# Patient Record
Sex: Female | Born: 1935 | Race: White | Hispanic: No | Marital: Single | Smoking: Former smoker
Health system: Southern US, Community
[De-identification: ages and names within clinical notes are randomized; demographics above are authoritative.]

## PROBLEM LIST (undated history)

## (undated) DIAGNOSIS — F4310 Post-traumatic stress disorder, unspecified: Secondary | ICD-10-CM

## (undated) DIAGNOSIS — F329 Major depressive disorder, single episode, unspecified: Secondary | ICD-10-CM

## (undated) DIAGNOSIS — G20A1 Parkinson's disease without dyskinesia, without mention of fluctuations: Secondary | ICD-10-CM

## (undated) DIAGNOSIS — E079 Disorder of thyroid, unspecified: Secondary | ICD-10-CM

## (undated) DIAGNOSIS — F32A Depression, unspecified: Secondary | ICD-10-CM

## (undated) DIAGNOSIS — G20 Parkinson's disease: Secondary | ICD-10-CM

## (undated) HISTORY — PX: CHOLECYSTECTOMY: SHX55

## (undated) HISTORY — PX: EYE SURGERY: SHX253

## (undated) HISTORY — DX: Disorder of thyroid, unspecified: E07.9

## (undated) HISTORY — DX: Parkinson's disease without dyskinesia, without mention of fluctuations: G20.A1

## (undated) HISTORY — DX: Depression, unspecified: F32.A

## (undated) HISTORY — DX: Major depressive disorder, single episode, unspecified: F32.9

## (undated) HISTORY — DX: Post-traumatic stress disorder, unspecified: F43.10

## (undated) HISTORY — DX: Parkinson's disease: G20

---

## 2006-12-24 ENCOUNTER — Ambulatory Visit: Payer: Self-pay | Admitting: Gastroenterology

## 2006-12-31 ENCOUNTER — Ambulatory Visit: Payer: Self-pay | Admitting: Internal Medicine

## 2007-01-08 ENCOUNTER — Ambulatory Visit (HOSPITAL_COMMUNITY): Admission: RE | Admit: 2007-01-08 | Discharge: 2007-01-08 | Payer: Self-pay | Admitting: Gastroenterology

## 2007-01-14 ENCOUNTER — Ambulatory Visit: Payer: Self-pay | Admitting: Gastroenterology

## 2007-04-14 ENCOUNTER — Ambulatory Visit: Payer: Self-pay | Admitting: General Surgery

## 2007-05-26 ENCOUNTER — Ambulatory Visit: Payer: Self-pay | Admitting: Gastroenterology

## 2007-09-15 ENCOUNTER — Ambulatory Visit: Payer: Self-pay | Admitting: Otolaryngology

## 2007-09-17 ENCOUNTER — Ambulatory Visit: Payer: Self-pay | Admitting: Otolaryngology

## 2007-10-07 ENCOUNTER — Ambulatory Visit: Payer: Self-pay | Admitting: Internal Medicine

## 2007-12-28 ENCOUNTER — Ambulatory Visit: Payer: Self-pay | Admitting: Internal Medicine

## 2007-12-31 ENCOUNTER — Ambulatory Visit: Payer: Self-pay | Admitting: Specialist

## 2008-01-11 ENCOUNTER — Ambulatory Visit: Payer: Self-pay | Admitting: Cardiovascular Disease

## 2008-02-05 ENCOUNTER — Ambulatory Visit: Payer: Self-pay | Admitting: Specialist

## 2008-04-05 ENCOUNTER — Ambulatory Visit: Payer: Self-pay | Admitting: Unknown Physician Specialty

## 2008-07-06 ENCOUNTER — Ambulatory Visit: Payer: Self-pay | Admitting: Specialist

## 2008-08-11 ENCOUNTER — Ambulatory Visit: Payer: Self-pay | Admitting: Gastroenterology

## 2009-01-18 ENCOUNTER — Ambulatory Visit: Payer: Self-pay | Admitting: Internal Medicine

## 2009-03-20 ENCOUNTER — Ambulatory Visit: Payer: Self-pay | Admitting: Rheumatology

## 2009-04-10 ENCOUNTER — Ambulatory Visit: Payer: Self-pay | Admitting: Unknown Physician Specialty

## 2009-06-07 ENCOUNTER — Ambulatory Visit: Payer: Self-pay | Admitting: Gastroenterology

## 2010-01-23 ENCOUNTER — Ambulatory Visit: Payer: Self-pay | Admitting: Internal Medicine

## 2010-01-26 ENCOUNTER — Inpatient Hospital Stay: Payer: Self-pay | Admitting: Internal Medicine

## 2010-02-08 ENCOUNTER — Ambulatory Visit: Payer: Self-pay | Admitting: Neurology

## 2010-08-21 ENCOUNTER — Ambulatory Visit: Payer: Self-pay

## 2011-05-21 ENCOUNTER — Ambulatory Visit: Payer: Self-pay

## 2012-01-28 ENCOUNTER — Ambulatory Visit: Payer: Self-pay | Admitting: Cardiovascular Disease

## 2012-01-28 LAB — BASIC METABOLIC PANEL
Anion Gap: 6 — ABNORMAL LOW (ref 7–16)
BUN: 17 mg/dL (ref 7–18)
Chloride: 107 mmol/L (ref 98–107)
Creatinine: 0.86 mg/dL (ref 0.60–1.30)
EGFR (African American): >60
EGFR (Non-African Amer.): >60
Glucose: 83 mg/dL (ref 65–99)
Osmolality: 282 (ref 275–301)
Potassium: 3.8 mmol/L (ref 3.5–5.1)

## 2012-01-28 LAB — CBC WITH DIFFERENTIAL/PLATELET
Basophil #: 0 10*3/uL (ref 0.0–0.1)
Basophil %: 0.9 %
Eosinophil #: 0.2 10*3/uL (ref 0.0–0.7)
HCT: 35.1 % (ref 35.0–47.0)
HGB: 12.1 g/dL (ref 12.0–16.0)
Lymphocyte %: 42.2 %
MCH: 32 pg (ref 26.0–34.0)
MCHC: 34.4 g/dL (ref 32.0–36.0)
Monocyte %: 12.2 %
Neutrophil #: 2.1 10*3/uL (ref 1.4–6.5)
RBC: 3.78 10*6/uL — ABNORMAL LOW (ref 3.80–5.20)
RDW: 12.9 % (ref 11.5–14.5)

## 2012-06-29 ENCOUNTER — Ambulatory Visit: Payer: Self-pay | Admitting: Internal Medicine

## 2013-01-04 ENCOUNTER — Ambulatory Visit: Payer: Self-pay | Admitting: Internal Medicine

## 2013-02-12 ENCOUNTER — Observation Stay: Payer: Self-pay | Admitting: Internal Medicine

## 2013-02-12 LAB — URINALYSIS, COMPLETE
Bilirubin,UR: NEGATIVE
Blood: NEGATIVE
Glucose,UR: NEGATIVE mg/dL (ref 0–75)
Leukocyte Esterase: NEGATIVE
Nitrite: NEGATIVE
RBC,UR: 1 /HPF (ref 0–5)
Specific Gravity: 1.028 (ref 1.003–1.030)
Squamous Epithelial: NONE SEEN

## 2013-02-12 LAB — COMPREHENSIVE METABOLIC PANEL
Albumin: 3.8 g/dL (ref 3.4–5.0)
Anion Gap: 7 (ref 7–16)
BUN: 17 mg/dL (ref 7–18)
Calcium, Total: 9.4 mg/dL (ref 8.5–10.1)
Chloride: 104 mmol/L (ref 98–107)
Creatinine: 0.97 mg/dL (ref 0.60–1.30)
EGFR (African American): >60
EGFR (Non-African Amer.): 57 — ABNORMAL LOW
Osmolality: 277 (ref 275–301)
Potassium: 4.2 mmol/L (ref 3.5–5.1)
SGPT (ALT): 21 U/L (ref 12–78)
Sodium: 138 mmol/L (ref 136–145)
Total Protein: 6.6 g/dL (ref 6.4–8.2)

## 2013-02-12 LAB — CBC
HCT: 35.7 % (ref 35.0–47.0)
HGB: 11.8 g/dL — ABNORMAL LOW (ref 12.0–16.0)
MCHC: 33.1 g/dL (ref 32.0–36.0)
MCV: 94 fL (ref 80–100)
Platelet: 222 10*3/uL (ref 150–440)
RBC: 3.79 10*6/uL — ABNORMAL LOW (ref 3.80–5.20)
RDW: 12.8 % (ref 11.5–14.5)
WBC: 5.5 10*3/uL (ref 3.6–11.0)

## 2013-02-12 LAB — TROPONIN I: Troponin-I: <0.02 ng/mL

## 2013-02-13 ENCOUNTER — Ambulatory Visit: Payer: Self-pay | Admitting: Neurology

## 2013-02-13 DIAGNOSIS — I359 Nonrheumatic aortic valve disorder, unspecified: Secondary | ICD-10-CM

## 2013-02-13 LAB — LIPID PANEL
Cholesterol: 141 mg/dL (ref 0–200)
HDL Cholesterol: 47 mg/dL (ref 40–60)
Ldl Cholesterol, Calc: 69 mg/dL (ref 0–100)
VLDL Cholesterol, Calc: 25 mg/dL (ref 5–40)

## 2013-02-13 LAB — TSH: Thyroid Stimulating Horm: 1.58 u[IU]/mL

## 2013-02-13 LAB — MAGNESIUM: Magnesium: 1.8 mg/dL

## 2013-06-09 DIAGNOSIS — Z87442 Personal history of urinary calculi: Secondary | ICD-10-CM | POA: Insufficient documentation

## 2013-06-09 DIAGNOSIS — J449 Chronic obstructive pulmonary disease, unspecified: Secondary | ICD-10-CM | POA: Insufficient documentation

## 2013-06-09 DIAGNOSIS — F329 Major depressive disorder, single episode, unspecified: Secondary | ICD-10-CM | POA: Insufficient documentation

## 2013-06-09 DIAGNOSIS — J310 Chronic rhinitis: Secondary | ICD-10-CM | POA: Insufficient documentation

## 2013-06-09 DIAGNOSIS — K589 Irritable bowel syndrome without diarrhea: Secondary | ICD-10-CM | POA: Insufficient documentation

## 2013-06-09 DIAGNOSIS — K219 Gastro-esophageal reflux disease without esophagitis: Secondary | ICD-10-CM | POA: Insufficient documentation

## 2013-06-09 DIAGNOSIS — I10 Essential (primary) hypertension: Secondary | ICD-10-CM | POA: Insufficient documentation

## 2013-06-09 DIAGNOSIS — F32A Depression, unspecified: Secondary | ICD-10-CM | POA: Insufficient documentation

## 2013-06-09 DIAGNOSIS — E7800 Pure hypercholesterolemia, unspecified: Secondary | ICD-10-CM | POA: Insufficient documentation

## 2013-06-09 DIAGNOSIS — E042 Nontoxic multinodular goiter: Secondary | ICD-10-CM | POA: Insufficient documentation

## 2013-06-09 DIAGNOSIS — G250 Essential tremor: Secondary | ICD-10-CM | POA: Insufficient documentation

## 2014-04-12 ENCOUNTER — Ambulatory Visit: Payer: Self-pay | Admitting: Internal Medicine

## 2014-06-24 NOTE — Discharge Summary (Signed)
PATIENT NAME:  Kelly Blackburn, Kelly Blackburn MR#:  [id number] DATE OF BIRTH:  1934/12/03  DATE OF ADMISSION:  02/12/2013 DATE OF DISCHARGE:  02/14/2013  DISCHARGE DIAGNOSES: 1.  Transient ischemic attack. 2.  Hypertension.  3.  Hyperlipidemia.  4.  Viral gastroenteritis.  CONDITION ON DISCHARGE: Stable.   CODE STATUS: FULL code.   DISCHARGE DIAGNOSIS:  1.  Aricept 23 mg oral tablet once a day.  2.  Dexilant 60 mg oral delayed-release capsule once a day.  3.  Dicyclomine 10 mg oral capsule 3 times a day as needed.  4.  Levothyroxine 50 mcg oral tablet once a day.  5.  Lisinopril 20 mg oral tablet once a day.  6.  Gabapentin 600 mg once a day.  7.  Isosorbide mononitrate 60 mg extended-release once a day.  8.  Clopidogrel 75 mg once a day.  9.  Alprazolam 1 mg oral tablet 2 times a day.  10.  Propranolol 80 mg once a day.  11.  Atorvastatin 20 mg once a day.   DIET ON DISCHARGE: Low sodium, low fat, low cholesterol, Diet consistency: Regular.   ACTIVITY LIMITATION: As tolerated.   TIMEFRAME TO FOLLOW-UP: Within 1 to 2 weeks in neurology clinic. Advised to follow with Dr. Sherryll Blackburn in 2 weeks in neurology.   HISTORY AND HOSPITAL COURSE: The patient is a 80 year old Caucasian female with history of hypertension, hyperlipidemia, and hypothyroid who presented in the ED with left-sided weakness for 2 days. Complained of slurred speech and facial numbness both sides, especially on the left, and also had headache and leg numbness, denied any dysphagia, so admitted for observation for possible TIA. Work-up was ordered to look for any stroke or source of TIA. Carotid Doppler study, echocardiogram, and MRI were done. They were not showing any significant finding. Neurology saw the patient and suggested to get CT angiogram, which we also got without any significant finding and so we decided to discharge the patient home with followup in neurology clinic.  OTHER MEDICAL ISSUES: 1.  Hypertension. We continued  lisinopril. 2.  Hyperlipidemia. We continued statin. 3.  Gastroesophageal reflux disease. Continued PPI.  4.  History of seizure, which was questionable. The patient was following with neurologist, Dr. Sherryll Blackburn, in the office and he advised to continue following the same.  5.  During the hospital stay, the patient also had complaint of diarrhea and vomiting which she got better the next day with symptomatic management and no fever or rise in white cell count so we attributed it to viral gastroenteritis and advised to drink a lot of water and reassured that it will get corrected within 1 or 2 days.  IMPORTANT LABORATORY AND DIAGNOSTICS IN HOSPITAL: White cell count was 5.5, hemoglobin was 11.8. Creatinine was 0.97, potassium was 4.2. Troponin was less than 0.02. Urinalysis was negative. CT head without contrast: No CT evidence of acute infarction. Lipid panel: Cholesterol 141, triglyceride 123, HDL 47, and LDL 69. Echocardiogram showed ejection fraction of 55% to 60%, normal global left ventricular systolic function, impaired relaxation pattern of left ventricular diastolic filling. MRI of brain: Negative for intracranial hemorrhage. No mass or edema. Small hyperintensities in subcortical white matter bilaterally consistent with mild chronic microvascular ischemia. Brain stem and cerebellum are normal. CT angiogram performed which showed no acute intracranial abnormality. Negative CTA of head.   TOTAL TIME SPENT ON THIS DISCHARGE: 40 minutes. ____________________________ Kelly Blackburn [name], MD vgv:[id number] D: 02/15/2013 13:46:01 ET T: 02/15/2013 14:18:49 ET JOB#: [id number]  cc: Kelly Blackburn  Arrie EasternG. Deyanna Mctier, MD, <Dictator> Kelly LovelessNeelam S. Khan, MD Kelly K. Sherryll BurgerShah, MD  Altamese DillingVAIBHAVKUMAR Selby Slovacek MD ELECTRONICALLY SIGNED 02/22/2013 14:21

## 2014-06-24 NOTE — Consult Note (Signed)
Referring Physician:  Demetrios Loll :   Reason for Consult: Admit Date: 12-Feb-2013  Chief Complaint: "My legs keeps getting this burning feeling, and it happened again yesterday but I also had a hard time speaking"  Reason for Consult: transient neurologic deficit   History of Present Illness: History of Present Illness:   Kelly Blackburn is a 80 yoright-handed  woman with a history of HTN, dyslipidemia, and hypothyroidism who presented to [facility] for evaluation of transient sensory changes associated with difficulty speaking. She was in her usual state of health yesterday morning around 1000 when she suddenly felt bilateral burning sensation in her legs with moderate constant headache, similar to episodes she has been having intermittently for the last several weeks. This time it was associated with an abnormal "tightening" sensation in both sides of her face. Subsequently she developed dysarthria and word-finding difficulties, as well as a feeling of left-sided heaviness and decreased sensation over the left side of her body. Her family commented that they felt the left side of her face was drooping. She presented to [facility] via EMS, and the symptoms resolved en route after a total duration about about 30 minutes. note, the spells of headache, facial tightness, and bilateral leg burning sensation began only 2 or 3 weeks ago. She was seen by an outpatient neruologist who she says performed an EEG, which was normal, and prescribed a seizure medication (does not remember which). She did not feel that she needed this medication and did not take it. Since that time, she has experienced these episodes once every few days, but this is the first epidose associated with lateralizing neurologic deficits. These episodes occur at random times and do not seem to be precipitated by any certain position or activity.  ROS:  Review of Systems   As per HPI. In addition, the patient denies fevers, chills, visual changes,  chest discomfort, palpitations, cough, shortness of breath, nausea, vomiting, diarrhea, joint tenderness, or rashes. The remainder of a full 12-point review of systems is negative.   Past Medical/Surgical Hx:  hypothyroidism:   gerd:   hypertension:   cyst on kidney:   cyst on thyroid:   depression:   arthritis:   high cholesterol:   angina:   seasonal allergies:   cholecystectomy:   tonsillectomy:   Home Medications: Medication Instructions Last Modified Date/Time  Aricept 23 mg oral tablet 1 tab(s) orally once a day (at bedtime) 12-Dec-14 20:33  Bepreve 1.5% ophthalmic solution 1 drop(s) to each affected eye 2 times a day 12-Dec-14 20:33  Dexilant 60 mg oral delayed release capsule 1 cap(s) orally once a day 12-Dec-14 20:33  dicyclomine 10 mg oral capsule 1 cap(s) orally 3 times a day, As Needed 12-Dec-14 20:33  levothyroxine 50 mcg (0.05 mg) oral tablet 1 tab(s) orally once a day 12-Dec-14 20:33  lisinopril 10 mg oral tablet 1 tab(s) orally once a day 12-Dec-14 20:33  gabapentin 600 mg oral tablet 1 tab(s) orally once a day (at bedtime) 12-Dec-14 20:33  isosorbide mononitrate 60 mg oral tablet, extended release 1 tab(s) orally once a day (in the morning) 12-Dec-14 20:33  clopidogrel 75 mg oral tablet 1 tab(s) orally once a day 12-Dec-14 20:33  ALPRAZolam 1 mg oral tablet 1 tab(s) orally 2 times a day 12-Dec-14 20:33  propranolol 80 mg oral tablet 1 tab(s) orally once a day 12-Dec-14 20:33  nitroglycerin 0.4 mg sublingual tablet 1 tab(s) sublingual every 5 minutes up to 3 tabs as needed for chest pain. *if no relief  call md or go to emergency room* 12-Dec-14 20:33   Allergies:  Aspirin: GI Distress  Social/Family History: Lives With: alone  Social History: Quit smoking 30 years ago. Denies EtOH or illicit drug use.   Vital Signs: **Vital Signs.:   13-Dec-14 20:13  Vital Signs Type Routine  Temperature Temperature (F) 98.1  Celsius 36.7  Pulse Pulse 61  Respirations  Respirations 18  Systolic BP Systolic BP 616  Diastolic BP (mmHg) Diastolic BP (mmHg) 77  Mean BP 87  Pulse Ox % Pulse Ox % 96  Pulse Ox Activity Level  At rest  Oxygen Delivery Room Air/ 21 %   EXAM: Well-developed, well-nourished, in NAD. No conjunctival injection or scleral edema. Oropharynx clear. No carotid bruits auscultated. Normal S1, S2 and regular cardiac rhythm on exam. Lungs clear to auscultation bilaterally. Abdomen soft and nontender. Peripheral pulses palpated. No clubbing, cyanosis, or edema in extremities.  MENTAL STATUS: Alert and oriented to person, place, and time. Language fluent and appropriate. Cognition and memory conversationally intact. CRANIAL NERVES: Visual fields full to confrontation. PERRL. EOMI. Facial sensation subjectively diminished to pinprick bilaterally in a wide distribution spanning the lower V3 dermatome bilaterally down into the neck (C3 dermatome). Facial muscles full and symmetric. Hearing intact to finger rub. Uvula midline with symmetric palatal elevation. Tongue midline without fasciculations. MOTOR: Normal bulk and tone. Strength 5/5 in deltoids, biceps, triceps, wrist flexors and extensors, and hand intrinsics bilaterally. Strength 5/5 in iliopsoas, glutes, hamstrings, quads, and tib ant bilaterally. REFLEXES: 2+ in biceps, triceps, patella, and achilles bilaterally. Flexor plantar responses bilaterally. SENSORY: Intact to vibration and pinprick throughout. COORDINATION: No ataxia or dysmetria on finger-nose or heel-shin maneuvers. GAIT: Normal casual and tandem gait.  Lab Results: Thyroid:  13-Dec-14 05:16   Thyroid Stimulating Hormone 1.58 (0.45-4.50 (International Unit)  ----------------------- Pregnant patients have  different reference  ranges for TSH:  - - - - - - - - - -  Pregnant, first trimetser:  0.36 - 2.50 uIU/mL)  LabObservation:  13-Dec-14 06:19   OBSERVATION Reason for Test  Hepatic:  12-Dec-14 16:28   Bilirubin,  Total 0.4  Alkaline Phosphatase 73 (45-117 NOTE: New Reference Range 01/22/13)  SGPT (ALT) 21  SGOT (AST) 27  Total Protein, Serum 6.6  Albumin, Serum 3.8  Routine Chem:  12-Dec-14 16:28   Glucose, Serum 97  BUN 17  Creatinine (comp) 0.97  Sodium, Serum 138  Potassium, Serum 4.2  Chloride, Serum 104  CO2, Serum 27  Calcium (Total), Serum 9.4  Osmolality (calc) 277  eGFR (African American) >60  eGFR (Non-African American)  57 (eGFR values <53m/min/1.73 m2 may be an indication of chronic kidney disease (CKD). Calculated eGFR is useful in patients with stable renal function. The eGFR calculation will not be reliable in acutely ill patients when serum creatinine is changing rapidly. It is not useful in  patients on dialysis. The eGFR calculation may not be applicable to patients at the low and high extremes of body sizes, pregnant women, and vegetarians.)  Anion Gap 7  13-Dec-14 05:16   Cholesterol, Serum 141  Triglycerides, Serum 123  HDL (INHOUSE) 47  VLDL Cholesterol Calculated 25  LDL Cholesterol Calculated 69 (Result(s) reported on 13 Feb 2013 at 06:31AM.)  Magnesium, Serum 1.8 (1.8-2.4 THERAPEUTIC RANGE: 4-7 mg/dL TOXIC: > 10 mg/dL  -----------------------)  Cardiac:  12-Dec-14 16:28   Troponin I < 0.02 (0.00-0.05 0.05 ng/mL or less: NEGATIVE  Repeat testing in 3-6 hrs  if clinically indicated. >0.05 ng/mL: POTENTIAL  MYOCARDIAL INJURY. Repeat  testing in 3-6 hrs if  clinically indicated. NOTE: An increase or decrease  of 30% or more on serial  testing suggests a  clinically important change)  Routine UA:  12-Dec-14 16:28   Color (UA) Straw  Clarity (UA) Clear  Glucose (UA) Negative  Bilirubin (UA) Negative  Ketones (UA) Negative  Specific Gravity (UA) 1.028  Blood (UA) Negative  pH (UA) 9.0  Protein (UA) Negative  Nitrite (UA) Negative  Leukocyte Esterase (UA) Negative (Result(s) reported on 12 Feb 2013 at 04:54PM.)  RBC (UA) 1 /HPF  WBC (UA) <1  /HPF  Bacteria (UA) NONE SEEN  Epithelial Cells (UA) NONE SEEN  Result(s) reported on 12 Feb 2013 at 04:54PM.  Routine Hem:  12-Dec-14 16:28   WBC (CBC) 5.5  RBC (CBC)  3.79  Hemoglobin (CBC)  11.8  Hematocrit (CBC) 35.7  Platelet Count (CBC) 222 (Result(s) reported on 12 Feb 2013 at 04:39PM.)  MCV 94  MCH 31.2  MCHC 33.1  RDW 12.8   Radiology Impression: Radiology Impression: MRI HEAD WITHOUT CONTRAST    FINDINGS:  Negative for acute infarct.    Small hyperintensities in the subcortical white matter bilaterally  consistent with mild chronic microvascular ischemia. Brainstem and  cerebellum are normal.    Negative for intracranial hemorrhage. No mass or edema. Negative for  shift of the midline structures.    Mild mucosal edema right ethmoid sinus. Vessels at the base of the  brain are patent.   Impression/Recommendations: Recommendations:   IMPRESSION: Kelly Blackburn is a 80 yo woman with a history of dyslipidemia and recent episodes of headache, facial tightness and bilateral leg pain who presents with an apisode of the above symptoms an addition to dysarthria and left-sided sensory loss. Her neurologic exam is normal except for the finding of subjective loss to pinprick in the lower face and upper neck bilaterally, in a non-dermatomal distribution. Brain MRI performed today did not show any evidence of acute infarction. It is difficult to localize all her symptoms to a cerebrovascular territory, though the abrupt onset of the lateralized symptoms keeps TIA on the differential diagnosis. Seizure is an unlikely possibility with the clinical history. Complicated migraine is possible but is a diagnosis of exclusion. Finally, vasculopathies such as reversible cerebral vasoconstriction syndrome (RCVS) can be associated with fast-onset headache and focal neurologic deficits, though again there is not a parsimonious vascular territory implicated. Vascular imaging with CTA head/neck to  evaluate for focal stenosis or occlusion. This may also allow [place] to evaluate for intracranial vasculopathies such as RCVS, which a carotid doppler would not.Continue clopidogrel for stroke risk reduction. Even if this had been an ischemic infarct, there is no evidence that changing to a different antiplatelet agent in the setting of cryptogenic stroke confers any benefit. Would certainly recommend against anticoagulation unless a cardiac structural abnormality or arrythmia is foundNo indication for antiepileptic therapy at this timeRecommend outpatient neurology followup should her episodes of leg pain persist, for workup of possible complex regional pain syndrome (also atypical presentation) you for the opportunity to participate in Kelly Blackburn's care. Please page Neurology consults with any further questions.  Electronic Signatures: Carmin Richmond (MD)  (Signed 13-Dec-14 22:06)  Authored: REFERRING PHYSICIAN, Consult, History of Present Illness, Review of Systems, PAST MEDICAL/SURGICAL HISTORY, HOME MEDICATIONS, ALLERGIES, Social/Family History, NURSING VITAL SIGNS, Physical Exam-, LAB RESULTS, RADIOLOGY RESULTS, Recommendations   Last Updated: 13-Dec-14 22:06 by Carmin Richmond (MD)

## 2014-06-24 NOTE — H&P (Signed)
PATIENT NAME:  Kelly Blackburn, Kelly Blackburn MR#:  [id number] DATE OF BIRTH:  1934-10-14  DATE OF ADMISSION:  02/12/2013  PRIMARY CARE PHYSICIAN: Yves Dill, MD REFERRING PHYSICIAN: Dr. Lenard Lance.  CHIEF COMPLAINT: Left-sided weakness, 2 days.   HISTORY OF PRESENT ILLNESS: This is a 80 year old Caucasian female with a history of hypertension, hyperlipidemia, hypothyroidism who presented in the ED with left-sided weakness for 2 days. In addition, the patient complains of slurred speech and facial numbness on both sides, especially on the left side. The patient also complains of a headache and leg numbness. The patient denies any dysphagia. No incontinence. Denies any chest pain, palpitations. No orthopnea or nocturnal dyspnea. No leg edema.   PAST MEDICAL HISTORY: Hypertension, hyperlipidemia, hypothyroidism, GERD.   PAST SURGICAL HISTORY: Cholecystectomy.   SOCIAL HISTORY: No smoking or drinking or illicit drugs.   FAMILY HISTORY: Mother had heart attack and stroke.   ALLERGIES: ASPIRIN.   HOME MEDICATIONS:  1.  Alprazolam 1 mg p.o. b.i.d. 2.  Aricept 23 mg p.o. at bedtime.  3.  Bepreve 1.5 percent ophthalmic solution 1 drop to each affected eye twice a day.  4.  Plavix 75 mg p.o. daily.  5.  Dexilant 60 mg p.o. once a day.  6.  Dicyclomine 10 mg p.o. t.i.d. p.r.n.  7.  Gabapentin 600 mg p.o. at bedtime.  8.  Imdur 60 mg p.o. in the morning.  9.  Levothyroxine 50 mcg p.o. daily.  10.  Lisinopril 10 mg p.o. daily.  11.  Nitroglycerin 0.4 mg tablets every 5 minutes up to 3 tablets p.r.n. for chest pain.  12.  Propranolol 80 mg p.o. daily.   REVIEW OF SYSTEMS: CONSTITUTIONAL: Denies any fever or chills, but has headache, dizziness and weakness on the left side.  EYES: No double vision or blurry vision.  ENT: No postnasal drip but has slurred speech. No dysphagia.  CARDIOVASCULAR: No chest pain, palpitation, orthopnea, or nocturnal dyspnea. No leg edema.  PULMONARY: No cough, sputum,  shortness of breath or hemoptysis.  GASTROINTESTINAL: No abdominal pain, nausea, vomiting or diarrhea. No melena or bloody stool.  GENITOURINARY: No dysuria, hematuria, or incontinence.  SKIN: No rash or jaundice.  NEUROLOGY: No syncope, loss of consciousness or seizure but has slurred speech. Left side weakness.  HEMATOLOGIC: No easy bruising or bleeding.  ENDOCRINE: No polyuria, polydipsia, heat or cold intolerance.  SKIN: No rash or jaundice.   PHYSICAL EXAMINATION:  VITAL SIGNS: Temperature 97.7, blood pressure 117/57, pulse 58, respirations 20, O2 saturation 98% on room air.  GENERAL: Alert, awake, oriented, in no acute distress.  HEENT: Pupils round, equal, reactive to light and accommodation.  NECK: Supple. No JVD or carotid bruits are noted. No lymphadenopathy. No thyromegaly.  CARDIOVASCULAR: S1, S2. Regular rate and rhythm. No murmurs or gallops.  PULMONARY: Bilateral air entry. No wheezing or rales. No use of accessory muscle to breathe.  ABDOMEN: Soft. No distention or tenderness. No organomegaly. Bowel sounds present.  EXTREMITIES: No edema, clubbing or cyanosis. No calf tenderness. Strong bilateral pedal pulses.  SKIN: No rash or jaundice.  NEUROLOGY: A and O x3. No focal deficit. Power five out of five and sensation intact. Deep tendon reflexes 2+.   LABORATORY DATA: WBC 5.5, hemoglobin 11.8 , platelets 222. Glucose 97, BUN 17, creatinine 0.97. Electrolytes normal. Troponin less than 0.02. Urinalysis is negative.   EKG showed sinus bradycardia at 53 BPM.   IMPRESSIONS:  1.  Transient ischemic attack.  2.  Hypertension.  3.  Hyperlipidemia.  4.  Hypothyroidism.  5.  Gastroesophageal reflux disease.  PLAN OF TREATMENT: The patient will be placed for observation. We will continue telemonitor. We will get a MRI of brain, carotid duplex, echocardiogram. We will continue Plavix, give statin, neuro checks. Continue the patient's home hypertension medication.   I discussed  the patient's condition and plan of treatment with the patient.   CODE STATUS: THE PATIENT WANTS FULL CODE.   TIME SPENT: About 50 minutes.    ____________________________ Shaune PollackQing Josslyn Ciolek, MD qc:np D: 02/12/2013 20:27:57 ET T: 02/12/2013 21:15:34 ET JOB#: [id number]  cc: Shaune PollackQing Baani Bober, MD, <Dictator> Shaune PollackQING Aniya Jolicoeur MD ELECTRONICALLY SIGNED 02/15/2013 14:25

## 2014-08-09 ENCOUNTER — Ambulatory Visit: Payer: Self-pay | Admitting: Psychiatry

## 2014-08-11 ENCOUNTER — Telehealth: Payer: Self-pay

## 2014-08-11 ENCOUNTER — Ambulatory Visit: Payer: Medicare Other | Admitting: Psychiatry

## 2014-08-11 NOTE — Telephone Encounter (Signed)
pt states she is leaving to go to [place] she needs 3 different rx for her klonopin 1mg  three times daily mailed to her daughter house.      Pt will be gone for 3 months and will need rx written for July , aug, sept.  Daughter address :  [address]    [address]    [place], [place] [address]

## 2014-08-25 MED ORDER — CLONAZEPAM 1 MG PO TABS: 1.0000 mg | ORAL_TABLET | Freq: Three times a day (TID) | ORAL | Status: DC

## 2014-08-25 NOTE — Telephone Encounter (Signed)
I have used this encounter to printout, I hope, 3 separate prescriptions for her clonazepam with a one-month supply of each one, each one with 0 refills. Apparently this is how [place] requires these to be written. She goes to [place] every summer and needs these printed out. I have not known this patient to abuse or overuse her medicine so I will go ahead and print these out. We had the same experience last summer. Not a face-to-face encounter but a reply to a phone request

## 2014-12-20 ENCOUNTER — Ambulatory Visit (INDEPENDENT_AMBULATORY_CARE_PROVIDER_SITE_OTHER): Payer: Medicare Other | Admitting: Psychiatry

## 2014-12-20 ENCOUNTER — Encounter: Payer: Self-pay | Admitting: Psychiatry

## 2014-12-20 ENCOUNTER — Other Ambulatory Visit: Payer: Self-pay | Admitting: Psychiatry

## 2014-12-20 VITALS — BP 110/76 | HR 62 | Temp 98.4°F | Ht 63.0 in | Wt 122.6 lb

## 2014-12-20 DIAGNOSIS — F320 Major depressive disorder, single episode, mild: Secondary | ICD-10-CM | POA: Insufficient documentation

## 2014-12-20 DIAGNOSIS — J309 Allergic rhinitis, unspecified: Secondary | ICD-10-CM | POA: Insufficient documentation

## 2014-12-20 DIAGNOSIS — F4310 Post-traumatic stress disorder, unspecified: Secondary | ICD-10-CM | POA: Insufficient documentation

## 2014-12-20 DIAGNOSIS — F331 Major depressive disorder, recurrent, moderate: Secondary | ICD-10-CM

## 2014-12-20 DIAGNOSIS — F321 Major depressive disorder, single episode, moderate: Secondary | ICD-10-CM | POA: Insufficient documentation

## 2014-12-20 MED ORDER — CITALOPRAM HYDROBROMIDE 40 MG PO TABS: 40.0000 mg | ORAL_TABLET | Freq: Every day | ORAL | Status: DC

## 2014-12-20 MED ORDER — CLONAZEPAM 1 MG PO TABS: 1.0000 mg | ORAL_TABLET | Freq: Three times a day (TID) | ORAL | Status: DC

## 2014-12-21 NOTE — Progress Notes (Signed)
[facility] MD Progress Note  12/21/2014 9:38 AM Kelly Blackburn  MRN:  [id number] Subjective:  Follow-up for this 80 year old woman with chronic anxiety and depression. I am aware that her husband had passed away since I last saw her as I coincidently had seen him as a consult in the hospital. The patient starts off by telling me that she is of the belief that he was intentionally killed by nurses from hospice. She seems to be rather unconcerned about this and says she assumes that that's normal procedure. I pointed out to her that that is frankly crazy talk. I was concerned about this but she doesn't seem to be expressing any other psychotic symptoms and otherwise is functioning adequately. For now I will consider this to probably be part of what is certainly some ongoing dementia. Patient says her mood is been fairly stable. Denies having had any suicidal thoughts or self. No new physical problems no problems with her medicine. Principal Problem: @ Diagnosis:   Patient Active Problem List   Diagnosis Date Noted  . Mild major depression (HCC) [F32.0] 12/20/2014  . Moderate major depression (HCC) [F32.1] 12/20/2014  . 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 Allergic rhinitis [J30.9] 12/20/2014  . Acid reflux [K21.9] 06/09/2013  . Clinical depression [F32.9] 06/09/2013  . Chronic obstructive pulmonary disease (HCC) [J44.9] 06/09/2013  . Benign essential tremor [G25.0] 06/09/2013  . Hypercholesterolemia [E78.00] 06/09/2013  . BP (high blood pressure) [I10] 06/09/2013  . Adaptive colitis [K59.8] 06/09/2013  . H/O renal calculi [Z87.442] 06/09/2013  . Goiter, nontoxic, multinodular [E04.2] 06/09/2013  . Inflamed nasal mucosa [J31.0] 06/09/2013   Total Time spent with patient: 20 minutes  Past Psychiatric History: Long history of anxiety and depression without suicide attempts partial response to medicine. Reasonably stable function  Past Medical History:  Past Medical History   Diagnosis Date  . Depression   . PTSD (post-traumatic stress disorder)   . Thyroid disease     Past Surgical History  Procedure Laterality Date  . Cholecystectomy    . Eye surgery     Family History:  Family History  Problem Relation Age of Onset  . Bipolar disorder Daughter   . Heart disease Mother   . Liver cancer Father   . Heart attack Father   . Depression Father   . Alcohol abuse Father   . Colon cancer Sister   . Skin cancer Brother    Family Psychiatric  History: Positive family history for anxiety Social History:  History  Alcohol Use No     History  Drug Use No    Social History   Social History  . Marital Status: Single    Spouse Name: N/A  . Number of Children: N/A  . Years of Education: N/A   Social History Main Topics  . Smoking status: Former Smoker    Types: Cigarettes    Start date: 12/19/1956    Quit date: 12/20/1966  . Smokeless tobacco: Never Used  . Alcohol Use: No  . Drug Use: No  . Sexual Activity: No   Other Topics Concern  . None   Social History Narrative   Additional Social History:                         Sleep: Good  Appetite:  Good  Current Medications: Current Outpatient Prescriptions  Medication Sig Dispense Refill  . atorvastatin (LIPITOR) 80 MG tablet     . BEPREVE 1.5 % SOLN     .  citalopram (CELEXA) 40 MG tablet Take 1 tablet (40 mg total) by mouth daily. 30 tablet 2  . clonazePAM (KLONOPIN) 1 MG tablet Take 1 tablet (1 mg total) by mouth 3 (three) times daily. 90 tablet 2  . clopidogrel (PLAVIX) 75 MG tablet Take by mouth.    . DEXILANT 60 MG capsule     . dicyclomine (BENTYL) 20 MG tablet Take by mouth.    . gabapentin (NEURONTIN) 600 MG tablet Take 1 tablet by mouth at bedtime.    . ibandronate (BONIVA) 150 MG tablet Take by mouth.    . isosorbide dinitrate (ISORDIL) 30 MG tablet Take by mouth.    . levocetirizine (XYZAL) 5 MG tablet Take by mouth.    . levothyroxine (SYNTHROID, LEVOTHROID) 50  MCG tablet Take by mouth.    Marland Kitchen. lisinopril (PRINIVIL,ZESTRIL) 10 MG tablet Take by mouth.    . mirtazapine (REMERON) 15 MG tablet Take by mouth.    . propranolol (INDERAL) 80 MG tablet     . RANEXA 500 MG 12 hr tablet      No current facility-administered medications for this visit.    Lab Results: No results found for this or any previous visit (from the past 48 hour(s)).  Physical Findings: AIMS:  , ,  ,  ,    CIWA:    COWS:     Musculoskeletal: Strength & Muscle Tone: within normal limits Gait & Station: normal Patient leans: N/A  Psychiatric Specialty Exam: ROS  Blood pressure 110/76, pulse 62, temperature 98.4 F (36.9 C), temperature source Tympanic, height 5\' 3"  (1.6 m), weight 122 lb 9.6 oz (55.611 kg), SpO2 84 %.Body mass index is 21.72 kg/(m^2).  General Appearance: Fairly Groomed  Patent attorneyye Contact::  Fair  Speech:  Slow  Volume:  Decreased  Mood:  Euthymic  Affect:  Blunt  Thought Process:  Circumstantial  Orientation:  Full (Time, Place, and Person)  Thought Content:  I'm not entirely sure what to make of this believe she has that her husband was intentionally killed except to chalk it up to some kind of real misunderstanding possibly related to dementia. She is not known to have had psychotic symptoms in the past and doesn't appear to have a more full blown paranoia in place.  Suicidal Thoughts:  No  Homicidal Thoughts:  No  Memory:  Immediate;   Good Recent;   Fair Remote;   Fair  Judgement:  Intact  Insight:  Fair  Psychomotor Activity:  Decreased  Concentration:  Fair  Recall:  FiservFair  Fund of Knowledge:Fair  Language: Fair  Akathisia:  No  Handed:  Right  AIMS (if indicated):     Assets:  Communication Skills Desire for Improvement Financial Resources/Insurance Housing Resilience Social Support  ADL's:  Intact  Cognition: Impaired,  Mild  Sleep:      Treatment Plan Summary: Medication management and Plan Supportive counseling completed. Patient  seems to be responding to an tolerating medicine well. Reviewed medicine side effects. She acknowledged that perhaps she was confused about her husband. She does not seem to be showing any signs of acute dangerousness. Reminded her to please get in touch with me if she is feeling worse. Follow-up in another 3 months.  John Clapacs 12/21/2014, 9:38 AM

## 2015-01-31 ENCOUNTER — Telehealth: Payer: Self-pay

## 2015-01-31 NOTE — Telephone Encounter (Signed)
pt wanted to get refill on gabapetin ? if you gave to patient or if someone else did?

## 2015-02-03 NOTE — Telephone Encounter (Signed)
After reviewing the chart it does not look like I have prescribed her gabapentin for her. Left a message for her letting her know this that she should get in contact with another provider.

## 2015-03-13 ENCOUNTER — Other Ambulatory Visit: Payer: Self-pay | Admitting: Psychiatry

## 2015-03-23 ENCOUNTER — Ambulatory Visit (INDEPENDENT_AMBULATORY_CARE_PROVIDER_SITE_OTHER): Payer: Medicare Other | Admitting: Licensed Clinical Social Worker

## 2015-03-23 ENCOUNTER — Encounter: Payer: Self-pay | Admitting: Psychiatry

## 2015-03-23 ENCOUNTER — Ambulatory Visit (INDEPENDENT_AMBULATORY_CARE_PROVIDER_SITE_OTHER): Payer: Medicare Other | Admitting: Psychiatry

## 2015-03-23 VITALS — BP 100/60 | HR 64 | Temp 98.6°F | Ht 63.0 in | Wt 122.8 lb

## 2015-03-23 DIAGNOSIS — F331 Major depressive disorder, recurrent, moderate: Secondary | ICD-10-CM

## 2015-03-23 MED ORDER — CLONAZEPAM 1 MG PO TABS: 1.0000 mg | ORAL_TABLET | Freq: Three times a day (TID) | ORAL | Status: DC

## 2015-03-23 MED ORDER — CITALOPRAM HYDROBROMIDE 40 MG PO TABS: 40.0000 mg | ORAL_TABLET | Freq: Every day | ORAL | Status: DC
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
## 2015-04-05 ENCOUNTER — Ambulatory Visit (INDEPENDENT_AMBULATORY_CARE_PROVIDER_SITE_OTHER): Payer: Medicare Other | Admitting: Licensed Clinical Social Worker

## 2015-04-05 DIAGNOSIS — F331 Major depressive disorder, recurrent, moderate: Secondary | ICD-10-CM | POA: Diagnosis not present

## 2015-04-08 IMAGING — CT CT ANGIO HEAD-NECK
2 of 11 series · 5 of 33 positions shown · IV contrast (APPLIED)
Comparison: MRI head 02/13/2013

CLINICAL DATA: TIA.  Rule out vascular lesion

EXAM:
CT ANGIOGRAPHY HEAD AND NECK
TECHNIQUE: Multidetector CT imaging of the head and neck was performed using
the standard protocol during bolus administration of intravenous
contrast. Multiplanar CT image reconstructions including MIPs were
obtained to evaluate the vascular anatomy. Carotid stenosis
measurements (when applicable) are obtained utilizing NASCET
criteria, using the distal internal carotid diameter as the
denominator.
CONTRAST:  80 mL Isovue 370 IV

[Series 10: ax thin · axial · 0.39mm/px · z∈[-194,-80]mm · 2 of 339 slices shown]
[im 113/339  soft-tissue]
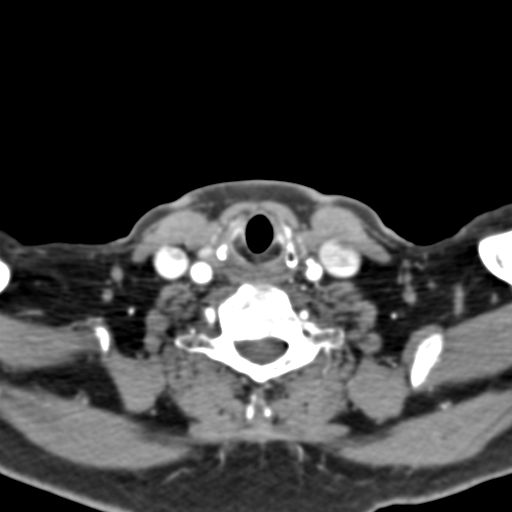
[im 226/339  soft-tissue]
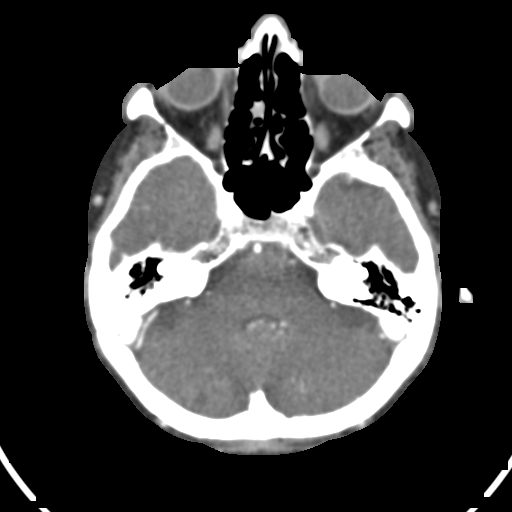

[Series 17: cta head · axial · 0.39mm/px · z∈[-223,-51]mm · 3 of 340 slices shown]
[im 85/340  soft-tissue]
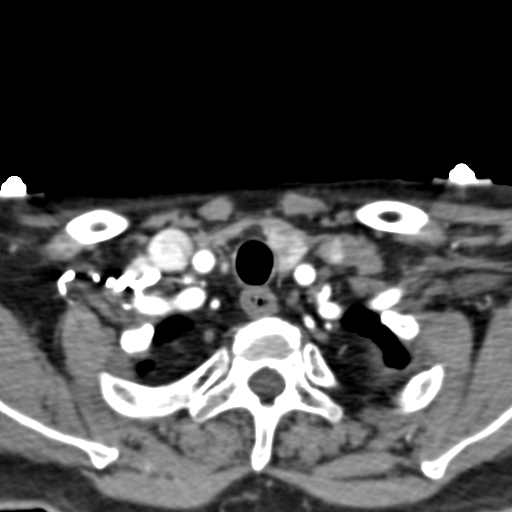
[im 170/340  bone]
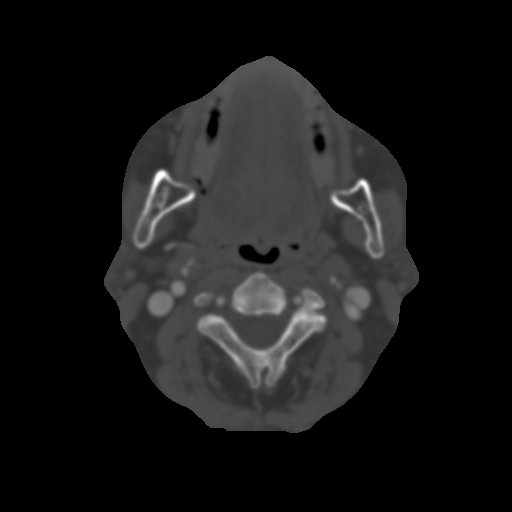
[im 255/340  soft-tissue]
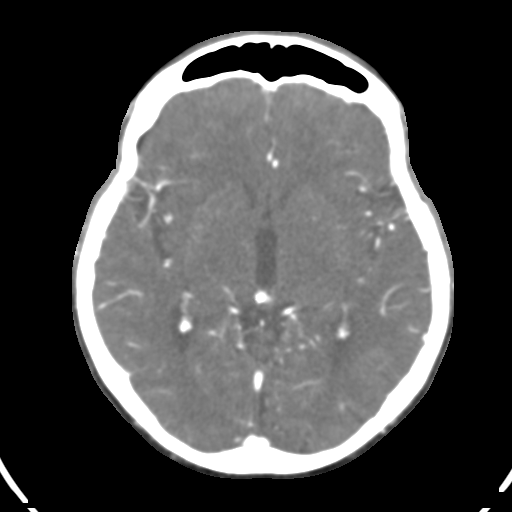

[5 of 33 positions shown; findings below may reference images not displayed]

FINDINGS: CTA HEAD FINDINGS

Age-appropriate atrophy. Mild chronic microvascular ischemic change.
Negative for acute infarct, hemorrhage, or mass. Normal enhancement
following contrast administration

Both vertebral arteries are patent to the basilar. Posterior
inferior cerebellar artery is patent bilaterally. The basilar is
widely patent. AICA patent bilaterally. Superior cerebellar and
posterior cerebral arteries are patent bilaterally. Hypoplastic
right P1 segment with fetal origin of the right posterior cerebral
artery.

The cavernous carotid artery is patent bilaterally without
significant stenosis. No significant atherosclerotic disease or
calcification. Anterior and middle cerebral arteries are widely
patent bilaterally.

Negative for cerebral aneurysm.

Review of the MIP images confirms the above findings.

CTA NECK FINDINGS

Three vessel aortic arch. Mild atherosclerotic calcification in the
innominate artery and proximal left subclavian artery. No
significant stenosis in the proximal great vessels.

Multiple small thyroid nodules are present. Negative for cervical
adenopathy.

Right carotid: Right common carotid artery is widely patent. Right
carotid bifurcation is patent without significant stenosis. Negative
for atherosclerotic disease or dissection.

Left carotid: Mild atherosclerotic disease in the mid left common
carotid artery not causing significant stenosis. Mild
atherosclerotic calcification of the carotid bifurcation without
significant stenosis. Negative for dissection.

Vertebral arteries: Both vertebral arteries are widely patent to the
basilar without stenosis.

Review of the MIP images confirms the above findings.
IMPRESSION: No acute intracranial abnormality.

Negative CTA of the head.

No significant stenosis in the carotid or vertebral arteries. Mild
atherosclerotic disease.

## 2015-04-14 NOTE — Progress Notes (Signed)
   THERAPIST PROGRESS NOTE  Session Time: 38mn  Participation Level: Active  Behavioral Response: CasualAlertDepressed  Type of Therapy: Individual Therapy  Treatment Goals addressed: Coping  Interventions: CBT, Motivational Interviewing, Solution Focused, Family Systems and Reframing  Summary: Kelly BREAULTis a 81y.o. female who presents with depression.  Therapist met with Patient in an initial therapy session to assess current mood and to build rapport. Therapist engaged Patient in discussion about her life and what is going well for her.  Therapist provided support for Patient as she shared details about her life, her current stressors, mood, coping skills, her past, and her children.  Therapist prompted Patient to discuss her support system and ways that she manages her daily stress, anger, and frustrations.  Therapist and Patient discussed education, long term goals and short term goals.  Therapist challenged Patient to go beyond her previous thought patterns as would continuously limit herself throughout the discussion.  Therapist encouraged Patient to focus on her strengths and the positive aspects of her life versus the opposite.  Therapist assisted Patient with this task by guiding her through a short list of job task that she is able to complete.  Therapist praised Patient for her effort and encouraged her to go forward in pointing out the positive things about self. Therapist shifted focus of discussion to medication management.  Therapist and Patient discussed the effectiveness of her current medications and the importance of ensuring that she continues to comply with her medication regimen. Therapist stressed the importance of mood stabilization through her medication regimen. Therapist and Patient agreed on a meeting time for the upcoming week. Therapist will continue to assist Patient with making progress towards her goals through therapeutic intervention.   Suicidal/Homicidal:  Nowithout intent/plan  Therapist Response: LCSW provided Patient with ongoing emotional support and encouragement.  Normalized her feelings.  Commended Patient on her progress and reinforced the importance of client staying focused on her own strengths and resources and resiliency. Processed various strategies for dealing with stressors.    Plan: Return again in 2 weeks.  Diagnosis: Axis I: Depression    Axis II: No diagnosis    NLubertha South02/03/2015

## 2015-04-19 ENCOUNTER — Ambulatory Visit (INDEPENDENT_AMBULATORY_CARE_PROVIDER_SITE_OTHER): Payer: Medicare Other | Admitting: Licensed Clinical Social Worker

## 2015-04-19 DIAGNOSIS — F331 Major depressive disorder, recurrent, moderate: Secondary | ICD-10-CM | POA: Diagnosis not present

## 2015-04-26 ENCOUNTER — Other Ambulatory Visit: Payer: Self-pay | Admitting: Psychiatry

## 2015-05-02 NOTE — Progress Notes (Signed)
   THERAPIST PROGRESS NOTE  Session Time:  Participation Level: Active  Behavioral Response: Casual and NeatAlertDepressed  Type of Therapy: Individual Therapy  Treatment Goals addressed: Coping  Interventions: CBT, Motivational Interviewing, Solution Focused, Supportive and Reframing  Summary: Kelly Blackburn is a 81 y.o. female who presents with continued symptoms of her diagnosis.  Patient was able to list her current stressors and discuss how she manages them.  Paitent states that she does not feel as if she is doing better with therapy.  Encouraged to continue.  Patient states that she is lonely and her children rarely check or visit her.  Patient was provided with volunteers information as well as Senior activities in the area.  Patient was encouraged to continue her medication regimen.   Suicidal/Homicidal: Nowithout intent/plan  Therapist Response: LCSW provided Patient with ongoing emotional support and encouragement.  Normalized her feelings.  Commended Patient on her progress and reinforced the importance of client staying focused on her own strengths and resources and resiliency. Processed various strategies for dealing with stressors.    Plan: Discussion of Therapist going on Maternity Leave and provided patient with a list of therapist in the area.  Diagnosis: Axis I: Depression    Axis II: No diagnosis    Marinda Elk 04/19/2015

## 2015-05-10 NOTE — Progress Notes (Signed)
[facility]  05/10/2015 4:56 PM Lanny HurstJoan E Blackburn  MRN:  [id number] Subjective:  Follow-up 81 year old woman with chronic anxiety. History of depression. Continues to mourn the death of her late husband. Mood gets down at times. Not severely depressed. No report of any suicidality. Tolerating medicine well. Continues to have a reasonably robust social life despite some of her medical issues Principal Problem: @PPROB @ Diagnosis:   Patient Active Problem List   Diagnosis Date Noted  . Mild major depression (HCC) [F32.0] 12/20/2014  . Moderate major depression (HCC) [F32.1] 12/20/2014  . 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 Allergic rhinitis [J30.9] 12/20/2014  . Acid reflux [K21.9] 06/09/2013  . Clinical depression [F32.9] 06/09/2013  . Chronic obstructive pulmonary disease (HCC) [J44.9] 06/09/2013  . Benign essential tremor [G25.0] 06/09/2013  . Hypercholesterolemia [E78.00] 06/09/2013  . BP (high blood pressure) [I10] 06/09/2013  . Adaptive colitis [K59.8] 06/09/2013  . H/O renal calculi [Z87.442] 06/09/2013  . Goiter, nontoxic, multinodular [E04.2] 06/09/2013  . Inflamed nasal mucosa [J31.0] 06/09/2013  . Asthma-chronic obstructive pulmonary disease overlap syndrome (HCC) [J44.9] 06/09/2013   Total Time spent with patient: 20 minutes  Past Psychiatric History: Past history of chronic anxiety and depression and PTSD  Past Medical History:  Past Medical History  Diagnosis Date  . Depression   . PTSD (post-traumatic stress disorder)   . Thyroid disease   . Parkinson disease [facility](HCC)     Past Surgical History  Procedure Laterality Date  . Cholecystectomy    . Eye surgery     Family History:  Family History  Problem Relation Age of Onset  . Bipolar disorder Daughter   . Heart disease Mother   . Liver cancer Father   . Heart attack Father   . Depression Father   . Alcohol abuse Father   . Colon cancer Sister   . Skin cancer Brother    Family Psychiatric   History: Family history of substance abuse and bipolar disorder Social History:  History  Alcohol Use No     History  Drug Use No    Social History   Social History  . Marital Status: Single    Spouse Name: N/A  . Number of Children: N/A  . Years of Education: N/A   Social History Main Topics  . Smoking status: Former Smoker    Types: Cigarettes    Start date: 12/19/1956    Quit date: 12/20/1966  . Smokeless tobacco: Never Used  . Alcohol Use: No  . Drug Use: No  . Sexual Activity: No   Other Topics Concern  . None   Social History Narrative   Additional Social History:                         Sleep: Good  Appetite:  Good  Current Medications: Current Outpatient Prescriptions  Medication Sig Dispense Refill  . atorvastatin (LIPITOR) 80 MG tablet     . Azelastine HCl (ASTEPRO) 0.15 % SOLN Place into the nose.    Marland Kitchen. BEPREVE 1.5 % SOLN     . carbidopa-levodopa (SINEMET IR) 25-100 MG tablet Take by mouth.    . cetirizine (ZYRTEC) 5 MG tablet Take 5 mg by mouth daily.    . citalopram (CELEXA) 40 MG tablet Take 1 tablet (40 mg total) by mouth daily. 30 tablet 3  . clonazePAM (KLONOPIN) 1 MG tablet Take 1 tablet (1 mg total) by mouth 3 (three) times daily. 90 tablet 3  .  clopidogrel (PLAVIX) 75 MG tablet Take by mouth.    . DEXILANT 60 MG capsule     . ELIDEL 1 % cream     . isosorbide dinitrate (ISORDIL) 30 MG tablet Take by mouth.    . isosorbide mononitrate (IMDUR) 60 MG 24 hr tablet Take 60 mg by mouth daily.    Marland Kitchen levocetirizine (XYZAL) 5 MG tablet Take by mouth.    . levothyroxine (SYNTHROID, LEVOTHROID) 25 MCG tablet Take 25 mcg by mouth daily.    Marland Kitchen levothyroxine (SYNTHROID, LEVOTHROID) 50 MCG tablet Take by mouth.    Marland Kitchen lisinopril (PRINIVIL,ZESTRIL) 5 MG tablet Take 5 mg by mouth daily.    . mirtazapine (REMERON) 15 MG tablet Take by mouth.    . propranolol (INDERAL) 80 MG tablet     . RANEXA 500 MG 12 hr tablet     . gabapentin (NEURONTIN) 600 MG  tablet TAKE ONE TABLET BY MOUTH AT BEDTIME 90 tablet 0   No current facility-administered medications for this visit.    Lab Results: No results found for this or any previous visit (from the past 48 hour(s)).  Blood Alcohol level:  No results found for: [facility]  Physical Findings: AIMS:  , ,  ,  ,    CIWA:    COWS:     Musculoskeletal: Strength & Muscle Tone: within normal limits Gait & Station: normal Patient leans: N/A  Psychiatric Specialty Exam: ROS  Blood pressure 100/60, pulse 64, temperature 98.6 F (37 C), temperature source Tympanic, height  (1.6 m), weight 122 lb 12.8 oz (55.702 kg), SpO2 90 %.Body mass index is 21.76 kg/(m^2).  General Appearance: Casual  Eye Contact::  Good  Speech:  Clear and Coherent  Volume:  Normal  Mood:  Euthymic  Affect:  Congruent  Thought Process:  Coherent  Orientation:  Full (Time, Place, and Person)  Thought Content:  Negative  Suicidal Thoughts:  No  Homicidal Thoughts:  No  Memory:  Immediate;   Good Recent;   Good Remote;   Good  Judgement:  Good  Insight:  Good  Psychomotor Activity:  Normal  Concentration:  Good  Recall:  Good  Fund of Knowledge:Good  Language: Good  Akathisia:  No  Handed:  Right  AIMS (if indicated):     Assets:  Desire for Improvement Financial Resources/Insurance Resilience Social Support Transportation  ADL's:  Intact  Cognition: WNL  Sleep:      Treatment Plan Summary: Medication management and Plan continue clonazepam and antidepressant. Supportive counseling. No change to medicine. I will see her back in 4 months.  Mordecai Rasmussen, MD 05/10/2015, 4:56 PM

## 2015-07-12 ENCOUNTER — Ambulatory Visit: Payer: Medicare Other | Attending: Neurology | Admitting: Occupational Therapy

## 2015-07-12 VITALS — BP 100/58 | HR 60

## 2015-07-12 DIAGNOSIS — R2681 Unsteadiness on feet: Secondary | ICD-10-CM | POA: Insufficient documentation

## 2015-07-12 DIAGNOSIS — R262 Difficulty in walking, not elsewhere classified: Secondary | ICD-10-CM | POA: Insufficient documentation

## 2015-07-12 DIAGNOSIS — M6281 Muscle weakness (generalized): Secondary | ICD-10-CM | POA: Insufficient documentation

## 2015-07-12 DIAGNOSIS — R278 Other lack of coordination: Secondary | ICD-10-CM | POA: Diagnosis present

## 2015-07-14 NOTE — Therapy (Signed)
[place] [facility] MAIN [facility] SERVICES [address] [place], [place], [address] Phone: [phone]   Fax:  [phone]  Occupational Therapy Evaluation  Patient Details  Name: Kelly Blackburn MRN: [id number] Date of Birth: 1934-03-08 No Data Recorded  Encounter Date: 07/12/2015      OT End of Session - 07/14/15 1157    Visit Number 1   Number of Visits 17   Date for OT Re-Evaluation 08/18/15   Authorization Type medicare G code 1   OT Start Time 1000   OT Stop Time 1110   OT Time Calculation (min) 70 min   Activity Tolerance Patient tolerated treatment well   Behavior During Therapy [facility] for tasks assessed/performed      Past Medical History  Diagnosis Date  . Depression   . PTSD (post-traumatic stress disorder)   . Thyroid disease   . Parkinson disease [facility])     Past Surgical History  Procedure Laterality Date  . Cholecystectomy    . Eye surgery      Filed Vitals:   07/14/15 1213  BP: 100/58  Pulse: 60        Subjective Assessment - 07/14/15 1148    Subjective  Both legs feel like they are stuck to the floor.  Feels like she may fall over at times.  Having difficulty with holding items in her hands, drops items frequently.     Pertinent History Patient reports she has had Parkinson's for several years now but is having more difficulty with moving around, balance and performing daily tasks. Her MD recommended she try LSVT BIG program to help improve her skills and independence.    Patient Stated Goals Patient reports she wants to be as independent as possible.     Currently in Pain? No/denies   Multiple Pain Sites No           [facility] OT Assessment - 07/14/15 1151    Assessment   Diagnosis Parkinson's disease   Precautions   Precautions Fall   Precaution Comments BERG balance score of 36/56   Balance Screen   Has the patient fallen in the past 6 months Yes   How many times? 2 times   Has the patient had a decrease in  activity level because of a fear of falling?  Yes   Is the patient reluctant to leave their home because of a fear of falling?  No   Home  Environment   Family/patient expects to be discharged to: Other (comment)  Senior living   Alternate Level Stairs - Number of Steps no   Bathroom Shower/Tub Tub/Shower unit;Curtain   Shower/tub characteristics Curtain   Firefighter Handicapped height   Bathroom Accessibility Yes   Home Equipment Grab bars - tub/shower;Grab bars - toilet   Lives With Alone   Prior Function   Level of Independence Independent   Vocation Retired   ADL   Eating/Feeding Modified independent   Grooming Modified independent   Upper Body Bathing Modified independent   Lower Body Bathing Modified independent   Upper Body Dressing Needs assist for fasteners   Lower Body Dressing Increased time   Toilet Tranfer Modified independent   Toileting - Clothing Manipulation Modified independent   Tub/Shower Transfer Modified independent   Transfers/Ambulation Related to ADL's no equipment use   IADL   Prior Level of Function Shopping independent   Shopping Needs to be accompanied on any shopping trip   Prior Level of Function Light Housekeeping independent  Light Housekeeping Performs light daily tasks such as dishwashing, bed making   Prior Level of Function Meal Prep independent   Meal Prep Able to complete simple warm meal prep   Prior Level of Function Community Mobility independent   Community Mobility Relies on family or friends for transportation   Medication Management Has difficulty remembering to take medication   Prior Level of Function Financial Management independent   Development worker, communityinancial Management Manages financial matters independently (budgets, writes checks, pays rent, bills goes to bank), collects and keeps track of income   Mobility   Mobility Status Freezing;History of falls   Written Expression   Handwriting Mild micrographia;50% legible   Vision - History    Baseline Vision Wears glasses all the time   Additional Comments reports she got a new prescription and can't see well with it, right eye is worse.    Cognition   Overall Cognitive Status Impaired/Different from baseline   Memory Impaired   Sensation   Light Touch Appears Intact   Stereognosis Appears Intact   Proprioception Appears Intact   Coordination   9 Hole Peg Test Right;Left   Right 9 Hole Peg Test 44 secs   Left 9 Hole Peg Test 57 secs   ROM / Strength   AROM / PROM / Strength AROM;Strength   AROM   Overall AROM  Within functional limits for tasks performed   Strength   Overall Strength Deficits   Overall Strength Comments Right UE 3+/5 overall, Left 3/5 overall.     Hand Function   Right Hand Grip (lbs) 26   Left Hand Grip (lbs) 10       6 minute walk test 535 feet 5 times sit to stand 30 secs BERG balance test 36/56 Freezing of gait questionairre 15  Patient seen for functional mobility skills this date with cues for amplitude of gait, techniques for sit to stand without use of arms.                   OT Education - 07/14/15 1156    Education provided Yes   Education Details role of OT, goals, LSVT BIG program   Person(s) Educated Patient   Methods Explanation   Comprehension Verbalized understanding;Returned demonstration             OT Long Term Goals - 07/14/15 1209    OT LONG TERM GOAL #1   Title Patient will improve gait speed and endurance and be able to walk 700 feet in 6 minutes to negotiate around the home and community safely in 4 weeks   Baseline 535 feet for 6 minute walk test at eval   Time 4   Period Weeks   Status New   OT LONG TERM GOAL #2   Title Patient will complete HEP for maximal daily exercises with modified independence in 4 weeks   Baseline no current home program   Time 4   Period Weeks   Status New   OT LONG TERM GOAL #3   Title Patient will transfer from sit to stand without the use of arms safely  and independently from a variety of chairs/surfaces in 4 weeks.    Baseline difficulty with lower and unstable surfaces, 5 times sit to stand 30 secs.    Time 4   Period Weeks   Status New   OT LONG TERM GOAL #4   Title Patient will decrease frequency of freezing episodes with score of 12 or less on Freezing of  Gait    Baseline score of 15 at eval.   Time 4   Period Weeks   Status New   OT LONG TERM GOAL #5   Title Patient will complete buttons with modified independence for dressing tasks.    Baseline difficulty to complete   Time 4   Period Weeks   Status New               Plan - 08/09/15 1158    Clinical Impression Statement Patient is a 81 yo female diagnosed with Parkinson's disease and was referred by her physician for LSVT BIG program. Patient presents with recent falls, decreased step length with gait patterns, decreased reciprocal arm swing, decreased balance, freezing of gait with initiation and termination of gait as well as with turns, decreased coordination, and muscle strength which affect her ability to perform daily tasks. The patient is judged to be an excellent candidate for the LSVT BIG program. She would benefit from and was referred for the LSVT BIG program which is an intensive program designed specifically for Parkinson's patients with a focus on increasing amplitude and speed of movements, improving self-care and daily tasks and providing patients with daily exercises to improve overall function. It is recommended that the patient receive the LSVT BIG program which is comprised of 16 intensive sessions (4 times per week for 4 weeks, one hour sessions). Prognosis for improvement is good based on her motivation and family support. LSVT BIG has been documented in the literature as efficacious for individuals with Parkinson's disease.      Rehab Potential Good   OT Frequency 4x / week   OT Duration 4 weeks   OT Treatment/Interventions Self-care/ADL  training;Therapeutic exercise;Functional Mobility Training;Patient/family education;Neuromuscular education;Balance training;Therapeutic exercises;DME and/or AE instruction;Therapeutic activities;Gait Training;Cognitive remediation/compensation;Stair Training   Consulted and Agree with Plan of Care Patient      Patient will benefit from skilled therapeutic intervention in order to improve the following deficits and impairments:  Decreased endurance, Decreased knowledge of precautions, Decreased activity tolerance, Decreased knowledge of use of DME, Decreased strength, Decreased balance, Decreased mobility, Difficulty walking, Abnormal gait, Decreased cognition, Decreased coordination, Impaired UE functional use  Visit Diagnosis: Difficulty in walking, not elsewhere classified - Plan: Ot plan of care cert/re-cert  Other lack of coordination - Plan: Ot plan of care cert/re-cert  Muscle weakness (generalized) - Plan: Ot plan of care cert/re-cert  Unsteadiness on feet - Plan: Ot plan of care cert/re-cert      G-Codes - 2015/08/09 1201    Functional Assessment Tool Used clinical judgment, 6 minute walk test, 5 times sit to stand, BERG balance test, freezing of gait questionairre.   Functional Limitation Mobility: Walking and moving around   Mobility: Walking and Moving Around Current Status [phone]) At least 60 percent but less than 80 percent impaired, limited or restricted   Mobility: Walking and Moving Around Goal Status [phone]) At least 20 percent but less than 40 percent impaired, limited or restricted      Problem List Patient Active Problem List   Diagnosis Date Noted  . Mild major depression (HCC) 12/20/2014  . Moderate major depression (HCC) 12/20/2014  . Neurosis, posttraumatic 12/20/2014  . Allergic rhinitis 12/20/2014  . Acid reflux 06/09/2013  . Clinical depression 06/09/2013  . Chronic obstructive pulmonary disease (HCC) 06/09/2013  . Benign essential tremor 06/09/2013  .  Hypercholesterolemia 06/09/2013  . BP (high blood pressure) 06/09/2013  . Adaptive colitis 06/09/2013  . H/O renal calculi 06/09/2013  .  Goiter, nontoxic, multinodular 06/09/2013  . Inflamed nasal mucosa 06/09/2013  . Asthma-chronic obstructive pulmonary disease overlap syndrome (HCC) 06/09/2013   Amy T Lovett, OTR/L, CLT  Lovett,Amy 07/14/2015, 12:15 PM  [place] MAIN [facility] SERVICES [address] [place], [place], [address] Phone: [phone]   Fax:  [phone]  Name: Kelly Blackburn MRN: [id number] Date of Birth: [birth year]

## 2015-07-17 ENCOUNTER — Ambulatory Visit: Payer: Medicare Other | Admitting: Occupational Therapy

## 2015-07-17 DIAGNOSIS — R262 Difficulty in walking, not elsewhere classified: Secondary | ICD-10-CM

## 2015-07-17 DIAGNOSIS — R2681 Unsteadiness on feet: Secondary | ICD-10-CM

## 2015-07-17 DIAGNOSIS — M6281 Muscle weakness (generalized): Secondary | ICD-10-CM

## 2015-07-17 DIAGNOSIS — R278 Other lack of coordination: Secondary | ICD-10-CM

## 2015-07-17 NOTE — Therapy (Signed)
[place] [facility] MAIN [facility] SERVICES [address] [place], [place], [address] Phone: [phone]   Fax:  [phone]  Occupational Therapy Treatment  Patient Details  Name: Kelly Blackburn MRN: [id number] Date of Birth: [birth year] No Data Recorded  Encounter Date: 07/17/2015      OT End of Session - 07/17/15 1422    Visit Number 2   Number of Visits 17   Date for OT Re-Evaluation 08/18/15   Authorization Type medicare G code 2   OT Start Time 0859   OT Stop Time 1000   OT Time Calculation (min) 61 min   Activity Tolerance Patient tolerated treatment well   Behavior During Therapy [facility] for tasks assessed/performed      Past Medical History  Diagnosis Date  . Depression   . PTSD (post-traumatic stress disorder)   . Thyroid disease   . Parkinson disease [facility])     Past Surgical History  Procedure Laterality Date  . Cholecystectomy    . Eye surgery      There were no vitals filed for this visit.      Subjective Assessment - 07/17/15 1419    Subjective  Patient reports she had a good weekend, had a good Mother's Day and ready to get started with exercise this week.    Pertinent History Patient reports she has had Parkinson's for several years now but is having more difficulty with moving around, balance and performing daily tasks. Her MD recommended she try LSVT BIG program to help improve her skills and independence.    Patient Stated Goals Patient reports she wants to be as independent as possible.     Currently in Pain? No/denies   Multiple Pain Sites No                      OT Treatments/Exercises (OP) - 07/17/15 1423    Neurological Re-education Exercises   Other Exercises 1 Patient seen for initial instruction of LSVT BIG exercises: LSVT Daily Session Maximal Daily Exercises: Sustained movements are designed to rescale the amplitude of movement output for generalization to daily functional activities. Performed as  follows for 1 set of 10 repetitions each: Multi directional sustained movements- 1) Floor to ceiling, 2) Side to side. Multi directional Repetitive movements performed in standing and are designed to provide retraining effort needed for sustained muscle activation in tasks Performed as follows: 3) Step and reach forward, 4) Step and Reach Backwards, 5) Step and reach sideways, 6) Rock and reach forward/backward, 7) Rock and reach sideways. Sit to stand from mat table on lowest setting with cues for weight shift, technique and CGA for 10 reps for 1 set.   Patient performing reciprocal stepping exercise with CGA and cues for 10 reps each foot, stair negotiation 4 steps for 5 reps each, cues for big turns, CGA.   Other Exercises 2 Functional mobility with cues for amplitude of steps and reciprocal arm swing for 500 feet with one rest break required, indoor flat surfaces,SBA.                OT Education - 07/17/15 1421    Education provided Yes   Education Details LSVT BIG maximal daily exercises   Person(s) Educated Patient   Methods Explanation;Demonstration;Verbal cues   Comprehension Verbal cues required;Returned demonstration;Verbalized understanding             OT Long Term Goals - 07/14/15 1209    OT LONG TERM GOAL #1  Title Patient will improve gait speed and endurance and be able to walk 700 feet in 6 minutes to negotiate around the home and community safely in 4 weeks   Baseline 535 feet for 6 minute walk test at eval   Time 4   Period Weeks   Status New   OT LONG TERM GOAL #2   Title Patient will complete HEP for maximal daily exercises with modified independence in 4 weeks   Baseline no current home program   Time 4   Period Weeks   Status New   OT LONG TERM GOAL #3   Title Patient will transfer from sit to stand without the use of arms safely and independently from a variety of chairs/surfaces in 4 weeks.    Baseline difficulty with lower and unstable surfaces,  5 times sit to stand 30 secs.    Time 4   Period Weeks   Status New   OT LONG TERM GOAL #4   Title Patient will decrease frequency of freezing episodes with score of 12 or less on Freezing of Gait    Baseline score of 15 at eval.   Time 4   Period Weeks   Status New   OT LONG TERM GOAL #5   Title Patient will complete buttons with modified independence for dressing tasks.    Baseline difficulty to complete   Time 4   Period Weeks   Status New               Plan - 07/17/15 1422    Clinical Impression Statement Patient was seen this date for initial instruction on LSVT BIG exercises, she was able to complete with CGA to min A for balance and cues for technique.  She tends to step forward putting foot towards midline or tandem stance and demos loss of balance, cues to spread feet apart more and stepping directly forwards.   She will require additional instruction on exercises to become proficient with use and she will likely require a modified version until her balance improves when performing at home.     Rehab Potential Good   OT Frequency 4x / week   OT Duration 4 weeks   OT Treatment/Interventions Self-care/ADL training;Therapeutic exercise;Functional Mobility Training;Patient/family education;Neuromuscular education;Balance training;Therapeutic exercises;DME and/or AE instruction;Therapeutic activities;Gait Training;Cognitive remediation/compensation;Stair Training   Consulted and Agree with Plan of Care Patient      Patient will benefit from skilled therapeutic intervention in order to improve the following deficits and impairments:  Decreased endurance, Decreased knowledge of precautions, Decreased activity tolerance, Decreased knowledge of use of DME, Decreased strength, Decreased balance, Decreased mobility, Difficulty walking, Abnormal gait, Decreased cognition, Decreased coordination, Impaired UE functional use  Visit Diagnosis: Difficulty in walking, not elsewhere  classified  Other lack of coordination  Muscle weakness (generalized)  Unsteadiness on feet    Problem List Patient Active Problem List   Diagnosis Date Noted  . Mild major depression (HCC) 12/20/2014  . Moderate major depression (HCC) 12/20/2014  . Neurosis, posttraumatic 12/20/2014  . Allergic rhinitis 12/20/2014  . Acid reflux 06/09/2013  . Clinical depression 06/09/2013  . Chronic obstructive pulmonary disease (HCC) 06/09/2013  . Benign essential tremor 06/09/2013  . Hypercholesterolemia 06/09/2013  . BP (high blood pressure) 06/09/2013  . Adaptive colitis 06/09/2013  . H/O renal calculi 06/09/2013  . Goiter, nontoxic, multinodular 06/09/2013  . Inflamed nasal mucosa 06/09/2013  . Asthma-chronic obstructive pulmonary disease overlap syndrome (HCC) 06/09/2013   Oral Hallgren T Jerzi Tigert, OTR/L, CLT  Carmie Lanpher  07/17/2015, 2:35 PM  [place] [facility] MAIN [facility] SERVICES [address] [place], [place], [address] Phone: [phone]   Fax:  [phone]  Name: Kelly Blackburn MRN: [id number] Date of Birth: [birth year]

## 2015-07-18 ENCOUNTER — Ambulatory Visit: Payer: Medicare Other | Admitting: Occupational Therapy

## 2015-07-18 DIAGNOSIS — R2681 Unsteadiness on feet: Secondary | ICD-10-CM

## 2015-07-18 DIAGNOSIS — R262 Difficulty in walking, not elsewhere classified: Secondary | ICD-10-CM

## 2015-07-18 DIAGNOSIS — M6281 Muscle weakness (generalized): Secondary | ICD-10-CM

## 2015-07-18 DIAGNOSIS — R278 Other lack of coordination: Secondary | ICD-10-CM

## 2015-07-19 ENCOUNTER — Encounter: Payer: Self-pay | Admitting: Occupational Therapy

## 2015-07-19 ENCOUNTER — Ambulatory Visit: Payer: Medicare Other | Admitting: Occupational Therapy

## 2015-07-19 DIAGNOSIS — R262 Difficulty in walking, not elsewhere classified: Secondary | ICD-10-CM

## 2015-07-19 DIAGNOSIS — R278 Other lack of coordination: Secondary | ICD-10-CM

## 2015-07-19 DIAGNOSIS — M6281 Muscle weakness (generalized): Secondary | ICD-10-CM

## 2015-07-19 DIAGNOSIS — R2681 Unsteadiness on feet: Secondary | ICD-10-CM

## 2015-07-19 NOTE — Therapy (Signed)
[place] [facility] MAIN [facility] SERVICES [address] [place], [place], [address] Phone: [phone]   Fax:  [phone]  Occupational Therapy Treatment  Patient Details  Name: Kelly Blackburn MRN: [id number] Date of Birth: 1934/05/29 No Data Recorded  Encounter Date: 07/19/2015      OT End of Session - 07/19/15 1533    Visit Number 4   Number of Visits 17   Date for OT Re-Evaluation 08/18/15   Authorization Type medicare G code 4   OT Start Time 0900   OT Stop Time 1000   OT Time Calculation (min) 60 min   Activity Tolerance Patient tolerated treatment well   Behavior During Therapy [facility] for tasks assessed/performed      Past Medical History  Diagnosis Date  . Depression   . PTSD (post-traumatic stress disorder)   . Thyroid disease   . Parkinson disease [facility])     Past Surgical History  Procedure Laterality Date  . Cholecystectomy    . Eye surgery      There were no vitals filed for this visit.      Subjective Assessment - 07/19/15 1532    Pertinent History Patient reports she has had Parkinson's for several years now but is having more difficulty with moving around, balance and performing daily tasks. Her MD recommended she try LSVT BIG program to help improve her skills and independence.    Patient Stated Goals Patient reports she wants to be as independent as possible.     Currently in Pain? No/denies   Multiple Pain Sites No                      OT Treatments/Exercises (OP) - 07/19/15 1536    Neurological Re-education Exercises   Other Exercises 1 Patient seen for LSVT BIG exercises: LSVT Daily Session Maximal Daily Exercises: Sustained movements are designed to rescale the amplitude of movement output for generalization to daily functional activities. Performed as follows for 1 set of 10 repetitions each: Multi directional sustained movements- 1) Floor to ceiling, 2) Side to side. Multi directional Repetitive  movements performed in standing and are designed to provide retraining effort needed for sustained muscle activation in tasks Performed as follows: 3) Step and reach forward, 4) Step and Reach Backwards, 5) Step and reach sideways, 6) Rock and reach forward/backward, 7) Rock and reach sideways. All standing exercises performed with CGA to minimal assist for balance and technique with the therapist.  Reinstructed patient on modifications to exercises to perform at home with use of a chair, issued written handout with pictures for reference in addition to performance in the clinic with feedback, instructed on how and where to place chair for each exercise.  She continues to demo understanding of needing adaptive exercises for balance at home when she does not have therapist to provide assistance. Sit to stand from mat table on lowest setting with cues for weight shift, technique and CGA for 10 reps for 1 set. Patient performing reciprocal stepping exercise with CGA and cues for 10 reps each foot, stair negotiation 4 steps for 5 reps each, cues for big turns, CGA and cues.    Other Exercises 2 Functional mobility with cues for amplitude of steps and reciprocal arm swing for 2 trials of 450 feet with progression to outdoor areas with sloped paths and winding pathways, rest breaks as needed.  Continued focus on step length, height and reciprocal arm patterns in a more relaxed and  normal fashion with cues.                  OT Education - 07/19/15 1532    Education provided Yes   Education Details HEP, adapted version with demo and performance   Person(s) Educated Patient   Methods Explanation;Demonstration;Verbal cues   Comprehension Verbal cues required;Returned demonstration;Verbalized understanding             OT Long Term Goals - 07/14/15 1209    OT LONG TERM GOAL #1   Title Patient will improve gait speed and endurance and be able to walk 700 feet in 6 minutes to negotiate around the home  and community safely in 4 weeks   Baseline 535 feet for 6 minute walk test at eval   Time 4   Period Weeks   Status New   OT LONG TERM GOAL #2   Title Patient will complete HEP for maximal daily exercises with modified independence in 4 weeks   Baseline no current home program   Time 4   Period Weeks   Status New   OT LONG TERM GOAL #3   Title Patient will transfer from sit to stand without the use of arms safely and independently from a variety of chairs/surfaces in 4 weeks.    Baseline difficulty with lower and unstable surfaces, 5 times sit to stand 30 secs.    Time 4   Period Weeks   Status New   OT LONG TERM GOAL #4   Title Patient will decrease frequency of freezing episodes with score of 12 or less on Freezing of Gait    Baseline score of 15 at eval.   Time 4   Period Weeks   Status New   OT LONG TERM GOAL #5   Title Patient will complete buttons with modified independence for dressing tasks.    Baseline difficulty to complete   Time 4   Period Weeks   Status New               Plan - 07/19/15 1533    Clinical Impression Statement Patient has made good progress over the last few sessions with performance of daily exercises, continues to display poor balance and will require adapted version of exercises at home with use of chair.  She is able to demo understanding in the clinic but benefits from cues from therapist for performance.  With functional mobility she demonstrated increased awareness and consistency with functional gait this date.  Continue to focus on these areas to improve performance to produce a functional gait pattern for home and in the community.   Rehab Potential Good   OT Frequency 4x / week   OT Duration 4 weeks   OT Treatment/Interventions Self-care/ADL training;Therapeutic exercise;Functional Mobility Training;Patient/family education;Neuromuscular education;Balance training;Therapeutic exercises;DME and/or AE instruction;Therapeutic  activities;Gait Training;Cognitive remediation/compensation;Stair Training   Consulted and Agree with Plan of Care Patient      Patient will benefit from skilled therapeutic intervention in order to improve the following deficits and impairments:  Decreased endurance, Decreased knowledge of precautions, Decreased activity tolerance, Decreased knowledge of use of DME, Decreased strength, Decreased balance, Decreased mobility, Difficulty walking, Abnormal gait, Decreased cognition, Decreased coordination, Impaired UE functional use  Visit Diagnosis: Difficulty in walking, not elsewhere classified  Other lack of coordination  Muscle weakness (generalized)  Unsteadiness on feet    Problem List Patient Active Problem List   Diagnosis Date Noted  . Mild major depression (HCC) 12/20/2014  . Moderate major  depression (HCC) 12/20/2014  . Neurosis, posttraumatic 12/20/2014  . Allergic rhinitis 12/20/2014  . Acid reflux 06/09/2013  . Clinical depression 06/09/2013  . Chronic obstructive pulmonary disease (HCC) 06/09/2013  . Benign essential tremor 06/09/2013  . Hypercholesterolemia 06/09/2013  . BP (high blood pressure) 06/09/2013  . Adaptive colitis 06/09/2013  . H/O renal calculi 06/09/2013  . Goiter, nontoxic, multinodular 06/09/2013  . Inflamed nasal mucosa 06/09/2013  . Asthma-chronic obstructive pulmonary disease overlap syndrome (HCC) 06/09/2013   Kalena Mander T Arne ClevelandLovett, OTR/L, CLT  Jaylei Fuerte 07/19/2015, 3:43 PM  [place] [facility] MAIN [facility] [address] [place], [place], [address] Phone: [phone]   Fax:  [phone]  Name: Kelly Blackburn MRN: [id number] Date of Birth: 05/16/1934

## 2015-07-19 NOTE — Therapy (Signed)
[place] [facility] MAIN [facility] SERVICES [address] [place], [place], [address] Phone: [phone]   Fax:  [phone]  Occupational Therapy Treatment  Patient Details  Name: Kelly Blackburn MRN: [id number] Date of Birth: 1934-09-11 No Data Recorded  Encounter Date: 07/18/2015      OT End of Session - 07/18/15 1447    Visit Number 3   Number of Visits 17   Date for OT Re-Evaluation 08/18/15   Authorization Type medicare G code 3   OT Start Time 0855   OT Stop Time 0959   OT Time Calculation (min) 64 min   Activity Tolerance Patient tolerated treatment well   Behavior During Therapy [facility] for tasks assessed/performed      Past Medical History  Diagnosis Date  . Depression   . PTSD (post-traumatic stress disorder)   . Thyroid disease   . Parkinson disease [facility])     Past Surgical History  Procedure Laterality Date  . Cholecystectomy    . Eye surgery      There were no vitals filed for this visit.      Subjective Assessment - 07/18/15 1440    Subjective  Patient reports she feels better about coming for therapy and moving again, "I need to get better at my walking and moving around."   Pertinent History Patient reports she has had Parkinson's for several years now but is having more difficulty with moving around, balance and performing daily tasks. Her MD recommended she try LSVT BIG program to help improve her skills and independence.    Patient Stated Goals Patient reports she wants to be as independent as possible.     Currently in Pain? No/denies   Multiple Pain Sites No                      OT Treatments/Exercises (OP) - 07/18/15 1441    Neurological Re-education Exercises   Other Exercises 1 Patient seen for LSVT BIG exercises: LSVT Daily Session Maximal Daily Exercises: Sustained movements are designed to rescale the amplitude of movement output for generalization to daily functional activities. Performed as follows  for 1 set of 10 repetitions each: Multi directional sustained movements- 1) Floor to ceiling, 2) Side to side. Multi directional Repetitive movements performed in standing and are designed to provide retraining effort needed for sustained muscle activation in tasks Performed as follows: 3) Step and reach forward, 4) Step and Reach Backwards, 5) Step and reach sideways, 6) Rock and reach forward/backward, 7) Rock and reach sideways. All standing exercises performed with CGA to minimal assist for balance and technique.  Instructed patient on modifications to exercises to perform at home with use of a chair, issued written handout with pictures for reference.  She demos understanding of needing adaptive exercises for balance at home when she does not have therapist to provide assistance. Sit to stand from mat table on lowest setting with cues for weight shift, technique and CGA for 10 reps for 1 set. Patient performing reciprocal stepping exercise with CGA and cues for 10 reps each foot, stair negotiation 4 steps for 5 reps each, cues for big turns, CGA and cues.    Other Exercises 2 Functional mobility with cues for amplitude of steps and reciprocal arm swing for 2 trials of 400 feet with progression to outdoor areas with sloped paths and winding pathways, rest breaks as needed.  Moderate cues to increase step size, patient tending to "scuff" feet on pavement  but responds well to cues for correction.                 OT Education - 07/18/15 1446    Education provided Yes   Education Details maximal daily exercises in adapted version with use of chair.   Person(s) Educated Patient   Methods Explanation;Demonstration;Tactile cues;Verbal cues   Comprehension Verbal cues required;Returned demonstration;Verbalized understanding;Tactile cues required             OT Long Term Goals - 07/14/15 1209    OT LONG TERM GOAL #1   Title Patient will improve gait speed and endurance and be able to walk 700  feet in 6 minutes to negotiate around the home and community safely in 4 weeks   Baseline 535 feet for 6 minute walk test at eval   Time 4   Period Weeks   Status New   OT LONG TERM GOAL #2   Title Patient will complete HEP for maximal daily exercises with modified independence in 4 weeks   Baseline no current home program   Time 4   Period Weeks   Status New   OT LONG TERM GOAL #3   Title Patient will transfer from sit to stand without the use of arms safely and independently from a variety of chairs/surfaces in 4 weeks.    Baseline difficulty with lower and unstable surfaces, 5 times sit to stand 30 secs.    Time 4   Period Weeks   Status New   OT LONG TERM GOAL #4   Title Patient will decrease frequency of freezing episodes with score of 12 or less on Freezing of Gait    Baseline score of 15 at eval.   Time 4   Period Weeks   Status New   OT LONG TERM GOAL #5   Title Patient will complete buttons with modified independence for dressing tasks.    Baseline difficulty to complete   Time 4   Period Weeks   Status New               Plan - 07/19/15 1448    Clinical Impression Statement Progressed patient to more challenging terrain this date outdoors with sloped and winding paths, she required moderate cues for amplitude of gait, with emphasis on not scuffing her feet when walking and cues for reciprocal arm swing.  She responds well to cues and demos insight into gait patterns this date.  She does require occasional rest breaks and was challenged this date with sit to stand for a variety of surfaces including a park bench, chair and rocker.    Rehab Potential Good   OT Frequency 4x / week   OT Duration 4 weeks   OT Treatment/Interventions Self-care/ADL training;Therapeutic exercise;Functional Mobility Training;Patient/family education;Neuromuscular education;Balance training;Therapeutic exercises;DME and/or AE instruction;Therapeutic activities;Gait Training;Cognitive  remediation/compensation;Stair Training   Consulted and Agree with Plan of Care Patient      Patient will benefit from skilled therapeutic intervention in order to improve the following deficits and impairments:  Decreased endurance, Decreased knowledge of precautions, Decreased activity tolerance, Decreased knowledge of use of DME, Decreased strength, Decreased balance, Decreased mobility, Difficulty walking, Abnormal gait, Decreased cognition, Decreased coordination, Impaired UE functional use  Visit Diagnosis: Difficulty in walking, not elsewhere classified  Other lack of coordination  Muscle weakness (generalized)  Unsteadiness on feet    Problem List Patient Active Problem List   Diagnosis Date Noted  . Mild major depression (HCC) 12/20/2014  . Moderate major  depression (HCC) 12/20/2014  . Neurosis, posttraumatic 12/20/2014  . Allergic rhinitis 12/20/2014  . Acid reflux 06/09/2013  . Clinical depression 06/09/2013  . Chronic obstructive pulmonary disease (HCC) 06/09/2013  . Benign essential tremor 06/09/2013  . Hypercholesterolemia 06/09/2013  . BP (high blood pressure) 06/09/2013  . Adaptive colitis 06/09/2013  . H/O renal calculi 06/09/2013  . Goiter, nontoxic, multinodular 06/09/2013  . Inflamed nasal mucosa 06/09/2013  . Asthma-chronic obstructive pulmonary disease overlap syndrome (HCC) 06/09/2013   Edge Mauger T Joana Nolton, OTR/L, CLT  Husam Hohn 07/19/2015, 2:51 PM  [place] [facility] [address] [place], [place], [address] Phone: [phone]   Fax:  [phone]  Name: Kelly Blackburn MRN: [id number] Date of Birth: 06/20/1934

## 2015-07-20 ENCOUNTER — Ambulatory Visit: Payer: Medicare Other | Admitting: Licensed Clinical Social Worker

## 2015-07-20 ENCOUNTER — Encounter: Payer: Self-pay | Admitting: Psychiatry

## 2015-07-20 ENCOUNTER — Ambulatory Visit (INDEPENDENT_AMBULATORY_CARE_PROVIDER_SITE_OTHER): Payer: Medicare Other | Admitting: Psychiatry

## 2015-07-20 VITALS — BP 110/62 | HR 66 | Temp 98.8°F | Wt 121.2 lb

## 2015-07-20 DIAGNOSIS — F331 Major depressive disorder, recurrent, moderate: Secondary | ICD-10-CM

## 2015-07-20 MED ORDER — CLONAZEPAM 1 MG PO TABS
1.0000 mg | ORAL_TABLET | Freq: Three times a day (TID) | ORAL | Status: DC
Start: 2015-07-20 — End: 2015-07-20

## 2015-07-20 MED ORDER — CLONAZEPAM 1 MG PO TABS: 1.0000 mg | ORAL_TABLET | Freq: Three times a day (TID) | ORAL | Status: DC

## 2015-07-20 MED ORDER — CITALOPRAM HYDROBROMIDE 40 MG PO TABS: 40.0000 mg | ORAL_TABLET | Freq: Every day | ORAL | Status: DC

## 2015-07-20 MED ORDER — GABAPENTIN 600 MG PO TABS: 600.0000 mg | ORAL_TABLET | Freq: Every day | ORAL | Status: DC

## 2015-07-21 ENCOUNTER — Ambulatory Visit: Payer: Medicare Other | Admitting: Occupational Therapy

## 2015-07-21 DIAGNOSIS — M6281 Muscle weakness (generalized): Secondary | ICD-10-CM

## 2015-07-21 DIAGNOSIS — R262 Difficulty in walking, not elsewhere classified: Secondary | ICD-10-CM | POA: Diagnosis not present

## 2015-07-21 DIAGNOSIS — R278 Other lack of coordination: Secondary | ICD-10-CM

## 2015-07-21 DIAGNOSIS — R2681 Unsteadiness on feet: Secondary | ICD-10-CM

## 2015-07-24 ENCOUNTER — Ambulatory Visit: Payer: Medicare Other | Admitting: Occupational Therapy

## 2015-07-24 DIAGNOSIS — M6281 Muscle weakness (generalized): Secondary | ICD-10-CM

## 2015-07-24 DIAGNOSIS — R2681 Unsteadiness on feet: Secondary | ICD-10-CM

## 2015-07-24 DIAGNOSIS — R262 Difficulty in walking, not elsewhere classified: Secondary | ICD-10-CM

## 2015-07-24 DIAGNOSIS — R278 Other lack of coordination: Secondary | ICD-10-CM

## 2015-07-24 NOTE — Therapy (Signed)
[place] [facility] [address] [place], [place], [address] Phone: [phone]   Fax:  [phone]  Occupational Therapy Treatment  Patient Details  Name: Kelly Blackburn MRN: [id number] Date of Birth: 01/19/1935 No Data Recorded  Encounter Date: 07/21/2015      OT End of Session - 07/23/15 1556    Visit Number 5   Number of Visits 17   Date for OT Re-Evaluation 08/18/15   Authorization Type medicare G code 5   Activity Tolerance Patient tolerated treatment well   Behavior [facility] for tasks assessed/performed      Past Medical History  Diagnosis Date  . Depression   . PTSD (post-traumatic stress disorder)   . Thyroid disease   . Parkinson disease [facility](HCC)     Past Surgical History  Procedure Laterality Date  . Cholecystectomy    . Eye surgery      There were no vitals filed for this visit.      Subjective Assessment - 07/23/15 1555    Subjective  Patient reports she is making dinner for her family tomorrow night, pasta.  Reports she tried exercises at home over the last day but was confused a bit despite having pictures and directions.    Patient Stated Goals Patient reports she wants to be as independent as possible.                        OT Treatments/Exercises (OP) - 07/23/15 1559    Neurological Re-education Exercises   Other Exercises 1 Patient seen for LSVT BIG exercises: LSVT Daily Session Maximal Daily Exercises: Sustained movements are designed to rescale the amplitude of movement output for generalization to daily functional activities. Performed as follows for 1 set of 10 repetitions each: Multi directional sustained movements- 1) Floor to ceiling, 2) Side to side. Multi directional Repetitive movements performed in standing and are designed to provide retraining effort needed for sustained muscle activation in tasks Performed as follows: 3) Step and reach forward, 4) Step and Reach  Backwards, 5) Step and reach sideways, 6) Rock and reach forward/backward, 7) Rock and reach sideways. All standing exercises performed with CGA to minimal assist for balance and technique with the therapist. Reinstructed patient on modifications to exercises to perform at home with use of a chair, issued written handout with pictures for reference in addition to performance in the clinic with feedback, instructed on how and where to place chair for each exercise. She continues to demo understanding of needing adaptive exercises for balance at home when she does not have therapist to provide assistance. Sit to stand from mat table on lowest setting with cues for weight shift, technique and CGA for 10 reps for 1 set. Patient performing reciprocal stepping exercise with CGA and cues for 10 reps each foot, stair negotiation 4 steps for 5 reps each, cues for big turns, CGA and cues.    Other Exercises 2 Functional mobility with cues for amplitude of steps and reciprocal arm swing for 2 trials of 500 feet with progression to outdoor areas with sloped paths and winding pathways, rest breaks as needed. Continued focus on step length, height and reciprocal arm patterns in a more relaxed and normal fashion with cues.                OT Education - 07/23/15 1555    Education provided Yes   Education Details HEP, maximal daily exercises, positioning.  Person(s) Educated Patient   Methods Explanation;Demonstration;Verbal cues   Comprehension Verbal cues required;Returned demonstration;Verbalized understanding             OT Long Term Goals - 07/14/15 1209    OT LONG TERM GOAL #1   Title Patient will improve gait speed and endurance and be able to walk 700 feet in 6 minutes to negotiate around the home and community safely in 4 weeks   Baseline 535 feet for 6 minute walk test at eval   Time 4   Period Weeks   Status New   OT LONG TERM GOAL #2   Title Patient will complete HEP for maximal daily  exercises with modified independence in 4 weeks   Baseline no current home program   Time 4   Period Weeks   Status New   OT LONG TERM GOAL #3   Title Patient will transfer from sit to stand without the use of arms safely and independently from a variety of chairs/surfaces in 4 weeks.    Baseline difficulty with lower and unstable surfaces, 5 times sit to stand 30 secs.    Time 4   Period Weeks   Status New   OT LONG TERM GOAL #4   Title Patient will decrease frequency of freezing episodes with score of 12 or less on Freezing of Gait    Baseline score of 15 at eval.   Time 4   Period Weeks   Status New   OT LONG TERM GOAL #5   Title Patient will complete buttons with modified independence for dressing tasks.    Baseline difficulty to complete   Time 4   Period Weeks   Status New               Plan - 07/23/15 1556    Clinical Impression Statement Patient has made progress with week with her functional mobility with increasing the amplitude of her steps, decreasing scuffing of her feet and increased reciprocal arm swing.  She continues to require assistance for exercises for balance and has to modify exercises at home for safety.  Will continue to work towards improving her ability to perform exercises at home, improving technique, size of movement and functional mobility.     Rehab Potential Good   OT Frequency 4x / week   OT Duration 4 weeks   OT Treatment/Interventions Self-care/ADL training;Therapeutic exercise;Functional Mobility Training;Patient/family education;Neuromuscular education;Balance training;Therapeutic exercises;DME and/or AE instruction;Therapeutic activities;Gait Training;Cognitive remediation/compensation;Stair Training   Consulted and Agree with Plan of Care Patient      Patient will benefit from skilled therapeutic intervention in order to improve the following deficits and impairments:  Decreased endurance, Decreased knowledge of precautions,  Decreased activity tolerance, Decreased knowledge of use of DME, Decreased strength, Decreased balance, Decreased mobility, Difficulty walking, Abnormal gait, Decreased cognition, Decreased coordination, Impaired UE functional use  Visit Diagnosis: Difficulty in walking, not elsewhere classified  Other lack of coordination  Muscle weakness (generalized)  Unsteadiness on feet    Problem List Patient Active Problem List   Diagnosis Date Noted  . Mild major depression (HCC) 12/20/2014  . Moderate major depression (HCC) 12/20/2014  . Neurosis, posttraumatic 12/20/2014  . Allergic rhinitis 12/20/2014  . Acid reflux 06/09/2013  . Clinical depression 06/09/2013  . Chronic obstructive pulmonary disease (HCC) 06/09/2013  . Benign essential tremor 06/09/2013  . Hypercholesterolemia 06/09/2013  . BP (high blood pressure) 06/09/2013  . Adaptive colitis 06/09/2013  . H/O renal calculi 06/09/2013  . Goiter, nontoxic,  multinodular 06/09/2013  . Inflamed nasal mucosa 06/09/2013  . Asthma-chronic obstructive pulmonary disease overlap syndrome (HCC) 06/09/2013   Syndi Pua T Arne Cleveland, OTR/L, CLT   Margot Oriordan 07/24/2015, 4:00 PM  [place] [facility] MAIN [facility] SERVICES [address] [place], [place], [address] Phone: [phone]   Fax:  [phone]  Name: HARBOR PASTER MRN: [id number] Date of Birth: [birth year]

## 2015-07-25 ENCOUNTER — Encounter: Payer: Medicare Other | Admitting: Occupational Therapy

## 2015-07-26 ENCOUNTER — Ambulatory Visit: Payer: Medicare Other | Admitting: Occupational Therapy

## 2015-07-26 DIAGNOSIS — R278 Other lack of coordination: Secondary | ICD-10-CM

## 2015-07-26 DIAGNOSIS — R262 Difficulty in walking, not elsewhere classified: Secondary | ICD-10-CM | POA: Diagnosis not present

## 2015-07-26 DIAGNOSIS — R2681 Unsteadiness on feet: Secondary | ICD-10-CM

## 2015-07-26 DIAGNOSIS — M6281 Muscle weakness (generalized): Secondary | ICD-10-CM

## 2015-07-27 ENCOUNTER — Ambulatory Visit: Payer: Medicare Other | Admitting: Occupational Therapy

## 2015-07-27 ENCOUNTER — Encounter: Payer: Self-pay | Admitting: Occupational Therapy

## 2015-07-27 DIAGNOSIS — R278 Other lack of coordination: Secondary | ICD-10-CM

## 2015-07-27 DIAGNOSIS — R262 Difficulty in walking, not elsewhere classified: Secondary | ICD-10-CM

## 2015-07-27 DIAGNOSIS — R2681 Unsteadiness on feet: Secondary | ICD-10-CM

## 2015-07-27 DIAGNOSIS — M6281 Muscle weakness (generalized): Secondary | ICD-10-CM

## 2015-07-27 NOTE — Therapy (Signed)
[facility] MAIN [facility] SERVICES [address] [place], [place], [address] Phone: [phone]   Fax:  [phone]  Occupational Therapy Treatment  Patient Details  Name: Kelly Blackburn MRN: [id number] Date of Birth: 05/21/1934 No Data Recorded  Encounter Date: 07/24/2015      OT End of Session - 07/27/15 1158    Visit Number 6   Number of Visits 17   Date for OT Re-Evaluation 08/18/15   Authorization Type medicare G code 6   OT Start Time 0859   OT Stop Time 1000   OT Time Calculation (min) 61 min   Activity Tolerance Patient tolerated treatment well   Behavior During Therapy [facility] for tasks assessed/performed      Past Medical History  Diagnosis Date  . Depression   . PTSD (post-traumatic stress disorder)   . Thyroid disease   . Parkinson disease [facility])     Past Surgical History  Procedure Laterality Date  . Cholecystectomy    . Eye surgery      There were no vitals filed for this visit.      Subjective Assessment - 07/26/15 1151    Subjective  Patient reports she had a good weekend and the dinner she made for her family went well.  She states her daughter noticed her walking and felt she was doing better with it in just a week.  She is pleased with her progress so far.    Pertinent History Patient reports she has had Parkinson's for several years now but is having more difficulty with moving around, balance and performing daily tasks. Her MD recommended she try LSVT BIG program to help improve her skills and independence.    Patient Stated Goals Patient reports she wants to be as independent as possible.     Currently in Pain? No/denies   Multiple Pain Sites No                      OT Treatments/Exercises (OP) - 07/26/15 1152    ADLs   ADL Comments Patient seen for establishing and performance of 5 functional component tasks as follows:  sit to stand, management of curbs, negotiating up and down steps,  handwriting skills for demographic information and picking up small objects and placing into specific place without dropping.   Neurological Re-education Exercises   Other Exercises 1 Patient seen for LSVT BIG exercises: LSVT Daily Session Maximal Daily Exercises: Sustained movements are designed to rescale the amplitude of movement output for generalization to daily functional activities. Performed as follows for 1 set of 10 repetitions each: Multi directional sustained movements- 1) Floor to ceiling, 2) Side to side. Multi directional Repetitive movements performed in standing and are designed to provide retraining effort needed for sustained muscle activation in tasks Performed as follows: 3) Step and reach forward, 4) Step and Reach Backwards, 5) Step and reach sideways, 6) Rock and reach forward/backward, 7) Rock and reach sideways. All standing exercises performed with CGA to minimal assist for balance and technique with the therapist. Reinstructed patient on modifications to exercises to perform at home with use of a chair, issued written handout with pictures for reference in addition to performance in the clinic with feedback, instructed on how and where to place chair for each exercise. She continues to demo understanding of needing adaptive exercises for balance at home when she does not have therapist to provide assistance. Sit to stand from mat table on lowest setting  with cues for weight shift, technique and CGA for 10 reps for 1 set. Patient performing reciprocal stepping exercise with CGA and cues for 10 reps each foot, stair negotiation 4 steps for 5 reps each, cues for big turns, CGA and cues.    Other Exercises 2 Functional mobility with cues for amplitude of steps and reciprocal arm swing for 1 trials of 750 feet indoors due to weather restrictions and rest breaks as needed. Continued focus on step length, height and reciprocal arm patterns in a more relaxed and normal fashion with cues.  She  tends to "stub" her toe or stumble more on flooring with linoleum like surface, CGA provided and assist for balance recovery for 3 episodes this date.                OT Education - 07/26/15 1158    Education provided Yes   Education Details HEP, balance, functional mobility   Person(s) Educated Patient   Methods Explanation;Demonstration;Verbal cues   Comprehension Verbal cues required;Returned demonstration;Verbalized understanding             OT Long Term Goals - 07/14/15 1209    OT LONG TERM GOAL #1   Title Patient will improve gait speed and endurance and be able to walk 700 feet in 6 minutes to negotiate around the home and community safely in 4 weeks   Baseline 535 feet for 6 minute walk test at eval   Time 4   Period Weeks   Status New   OT LONG TERM GOAL #2   Title Patient will complete HEP for maximal daily exercises with modified independence in 4 weeks   Baseline no current home program   Time 4   Period Weeks   Status New   OT LONG TERM GOAL #3   Title Patient will transfer from sit to stand without the use of arms safely and independently from a variety of chairs/surfaces in 4 weeks.    Baseline difficulty with lower and unstable surfaces, 5 times sit to stand 30 secs.    Time 4   Period Weeks   Status New   OT LONG TERM GOAL #4   Title Patient will decrease frequency of freezing episodes with score of 12 or less on Freezing of Gait    Baseline score of 15 at eval.   Time 4   Period Weeks   Status New   OT LONG TERM GOAL #5   Title Patient will complete buttons with modified independence for dressing tasks.    Baseline difficulty to complete   Time 4   Period Weeks   Status New               Plan - 07/26/15 1159    Clinical Impression Statement Patient demonstrates more difficulty with walking on lineolum like flooring than carpet or areas outdoors.  She had 3 episodes of her toe dragging and tripped, CGA provided during ambulation  and min assist for balance recovery and cues.  She continues to require cues for all exercises and CGA for exercises in standing.  Has difficulty recalling arm placements with exercises, continue to work towards improving performance in all areas and decrease risk for falls.     Rehab Potential Good   OT Frequency 4x / week   OT Duration 4 weeks   OT Treatment/Interventions Self-care/ADL training;Therapeutic exercise;Functional Mobility Training;Patient/family education;Neuromuscular education;Balance training;Therapeutic exercises;DME and/or AE instruction;Therapeutic activities;Gait Training;Cognitive remediation/compensation;Stair Training   Consulted and Agree with Plan of  Care Patient      Patient will benefit from skilled therapeutic intervention in order to improve the following deficits and impairments:  Decreased endurance, Decreased knowledge of precautions, Decreased activity tolerance, Decreased knowledge of use of DME, Decreased strength, Decreased balance, Decreased mobility, Difficulty walking, Abnormal gait, Decreased cognition, Decreased coordination, Impaired UE functional use  Visit Diagnosis: Difficulty in walking, not elsewhere classified  Other lack of coordination  Muscle weakness (generalized)  Unsteadiness on feet    Problem List Patient Active Problem List   Diagnosis Date Noted  . Mild major depression (HCC) 12/20/2014  . Moderate major depression (HCC) 12/20/2014  . Neurosis, posttraumatic 12/20/2014  . Allergic rhinitis 12/20/2014  . Acid reflux 06/09/2013  . Clinical depression 06/09/2013  . Chronic obstructive pulmonary disease (HCC) 06/09/2013  . Benign essential tremor 06/09/2013  . Hypercholesterolemia 06/09/2013  . BP (high blood pressure) 06/09/2013  . Adaptive colitis 06/09/2013  . H/O renal calculi 06/09/2013  . Goiter, nontoxic, multinodular 06/09/2013  . Inflamed nasal mucosa 06/09/2013  . 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 [place], [place], [address] Phone: [phone]   Fax:  [phone]  Name: Kelly Blackburn MRN: [id number] Date of Birth: 03/26/1934

## 2015-07-27 NOTE — Therapy (Signed)
[facility] [address] [place], [place], [address] Phone: [phone]   Fax:  [phone]  Occupational Therapy Treatment  Patient Details  Name: Kelly Blackburn MRN: [id number] Date of Birth: 06/06/1934 No Data Recorded  Encounter Date: 07/26/2015      OT End of Session - 07/27/15 2143    Visit Number 7   Number of Visits 17   Date for OT Re-Evaluation 08/18/15   Authorization Type medicare G code 7   OT Start Time 0900   OT Stop Time 0955   OT Time Calculation (min) 55 min   Activity Tolerance Patient tolerated treatment well   Behavior During Therapy [facility] for tasks assessed/performed      Past Medical History  Diagnosis Date  . Depression   . PTSD (post-traumatic stress disorder)   . Thyroid disease   . Parkinson disease [facility])     Past Surgical History  Procedure Laterality Date  . Cholecystectomy    . Eye surgery      There were no vitals filed for this visit.      Subjective Assessment - 07/26/15 2134    Subjective  Patient reports she has a difficult time getting to sleep at night and sometimes stays up until 3 am which makes it difficult to get up in the mornings to come to therapy.  Reports her daughter has been calling to make sure she is up each day.  "I think I am going to need a nap today."   Patient Stated Goals Patient reports she wants to be as independent as possible.     Currently in Pain? No/denies   Multiple Pain Sites No                      OT Treatments/Exercises (OP) - 07/26/15 2136    ADLs   ADL Comments Patient seen for establishing and performance of 5 functional component tasks as follows: sit to stand, management of curbs, negotiating up and down steps, handwriting skills for demographic information and picking up small objects and placing into specific place without dropping..  Multiple repetitions this date of writing address with street name and number, cues  for size of letters and to maintain legibility with increased production of writing.  Patient prefers to write in script versus print.     Neurological Re-education Exercises   Other Exercises 1 Patient seen for LSVT BIG exercises: LSVT Daily Session Maximal Daily Exercises: Sustained movements are designed to rescale the amplitude of movement output for generalization to daily functional activities. Performed as follows for 1 set of 10 repetitions each: Multi directional sustained movements- 1) Floor to ceiling, 2) Side to side. Multi directional Repetitive movements performed in standing and are designed to provide retraining effort needed for sustained muscle activation in tasks Performed as follows: 3) Step and reach forward, 4) Step and Reach Backwards, 5) Step and reach sideways, 6) Rock and reach forward/backward, 7) Rock and reach sideways. All standing exercises performed with CGA to minimal assist for balance and technique with the therapist. She continues to demo understanding of needing adaptive exercises for balance at home when she does not have therapist to provide assistance. Sit to stand from mat table on lowest setting with cues for weight shift, technique and CGA for 10 reps for 1 set. Patient performing reciprocal stepping exercise with CGA and cues for 10 reps each foot, stair negotiation 4 steps for 5 reps each  while holding an object in one hand and using hand rail for the other hand, cues for big turns, CGA and cues.   Other Exercises 2 Functional mobility with cues for amplitude of steps and reciprocal arm swing for 1 trials of 675 feet indoors due to weather restrictions, rest breaks as needed. Continued focus on step length, height and reciprocal arm patterns in a more relaxed and normal fashion with cues. Patient with loss of balance multiple times this date (3+) and required therapist assist for recovery of balance.  Appears to demonstrate decreased step through on linoleum versus  carpet.                 OT Education - 07/26/15 2142    Education provided Yes   Education Details balance, exercises, modifications for home program.   Person(s) Educated Patient   Methods Explanation;Demonstration;Verbal cues   Comprehension Verbal cues required;Returned demonstration;Verbalized understanding             OT Long Term Goals - 07/14/15 1209    OT LONG TERM GOAL #1   Title Patient will improve gait speed and endurance and be able to walk 700 feet in 6 minutes to negotiate around the home and community safely in 4 weeks   Baseline 535 feet for 6 minute walk test at eval   Time 4   Period Weeks   Status New   OT LONG TERM GOAL #2   Title Patient will complete HEP for maximal daily exercises with modified independence in 4 weeks   Baseline no current home program   Time 4   Period Weeks   Status New   OT LONG TERM GOAL #3   Title Patient will transfer from sit to stand without the use of arms safely and independently from a variety of chairs/surfaces in 4 weeks.    Baseline difficulty with lower and unstable surfaces, 5 times sit to stand 30 secs.    Time 4   Period Weeks   Status New   OT LONG TERM GOAL #4   Title Patient will decrease frequency of freezing episodes with score of 12 or less on Freezing of Gait    Baseline score of 15 at eval.   Time 4   Period Weeks   Status New   OT LONG TERM GOAL #5   Title Patient will complete buttons with modified independence for dressing tasks.    Baseline difficulty to complete   Time 4   Period Weeks   Status New               Plan - 07/26/15 2144    Clinical Impression Statement Continued difficulty noted at times with step through with gait which occurs more on lineolum than carpet and may be when she is more fatigued and distracted.  When focused to task patient performance improved.  Will continue to work towards patient being able to integrate greater amplitude of movement while  distractions of every day activities/surrounding are present.     Rehab Potential Good   OT Frequency 4x / week   OT Duration 4 weeks   OT Treatment/Interventions Self-care/ADL training;Therapeutic exercise;Functional Mobility Training;Patient/family education;Neuromuscular education;Balance training;Therapeutic exercises;DME and/or AE instruction;Therapeutic activities;Gait Training;Cognitive remediation/compensation;Stair Training   Consulted and Agree with Plan of Care Patient      Patient will benefit from skilled therapeutic intervention in order to improve the following deficits and impairments:  Decreased endurance, Decreased knowledge of precautions, Decreased activity tolerance, Decreased knowledge of  use of DME, Decreased strength, Decreased balance, Decreased mobility, Difficulty walking, Abnormal gait, Decreased cognition, Decreased coordination, Impaired UE functional use  Visit Diagnosis: Difficulty in walking, not elsewhere classified  Unsteadiness on feet  Muscle weakness (generalized)  Other lack of coordination    Problem List Patient Active Problem List   Diagnosis Date Noted  . Mild major depression (HCC) 12/20/2014  . Moderate major depression (HCC) 12/20/2014  . Neurosis, posttraumatic 12/20/2014  . Allergic rhinitis 12/20/2014  . Acid reflux 06/09/2013  . Clinical depression 06/09/2013  . Chronic obstructive pulmonary disease (HCC) 06/09/2013  . Benign essential tremor 06/09/2013  . Hypercholesterolemia 06/09/2013  . BP (high blood pressure) 06/09/2013  . Adaptive colitis 06/09/2013  . H/O renal calculi 06/09/2013  . Goiter, nontoxic, multinodular 06/09/2013  . Inflamed nasal mucosa 06/09/2013  . Asthma-chronic obstructive pulmonary disease overlap syndrome (HCC) 06/09/2013   Amy T Arne Cleveland, OTR/L, CLT  Lovett,Amy 07/27/2015, 9:48 PM  [facility] MAIN [facility] SERVICES [address] [place], [place], [address] Phone:  [phone]   Fax:  [phone]  Name: Kelly Blackburn MRN: [id number] Date of Birth: May 24, 1934

## 2015-07-27 NOTE — Therapy (Signed)
[facility] MAIN [facility] SERVICES [address] [place], [place], [address] Phone: [phone]   Fax:  [phone]  Occupational Therapy Treatment  Patient Details  Name: Kelly Blackburn MRN: [id number] Date of Birth: 19-Feb-1935 No Data Recorded  Encounter Date: 07/27/2015      OT End of Session - 07/27/15 2239    Visit Number 8   Number of Visits 17   Date for OT Re-Evaluation 08/18/15   Authorization Type medicare G code 8   OT Start Time 0904   OT Stop Time 1000   OT Time Calculation (min) 56 min   Activity Tolerance Patient tolerated treatment well   Behavior During Therapy [facility] for tasks assessed/performed      Past Medical History  Diagnosis Date  . Depression   . PTSD (post-traumatic stress disorder)   . Thyroid disease   . Parkinson disease [facility])     Past Surgical History  Procedure Laterality Date  . Cholecystectomy    . Eye surgery      There were no vitals filed for this visit.      Subjective Assessment - 07/27/15 2233    Subjective  Patient reports despite being tired yesterday, she planned to go to bed at 10:00 last night and her sister called and she didn't get off the phone until after 1230 am.  She is tired today but willing to work.  She reports she did not do exercises last night, discussed the importance of performing exercises 2 times a day during intensive portion of program.     Patient Stated Goals Patient reports she wants to be as independent as possible.     Currently in Pain? No/denies   Multiple Pain Sites No                      OT Treatments/Exercises (OP) - 07/27/15 2236    ADLs   ADL Comments Patient seen for establishing and performance of 5 functional component tasks as follows: sit to stand, management of curbs, negotiating up and down steps, handwriting skills for demographic information and picking up small objects and placing into specific place without dropping.. Multiple  repetitions this date of writing address with city, state and zip code this date, cues for size of letters and to maintain legibility with increased repetitions.   Neurological Re-education Exercises   Other Exercises 1 Patient seen for LSVT BIG exercises: LSVT Daily Session Maximal Daily Exercises: Sustained movements are designed to rescale the amplitude of movement output for generalization to daily functional activities. Performed as follows for 1 set of 10 repetitions each: Multi directional sustained movements- 1) Floor to ceiling, 2) Side to side. Multi directional Repetitive movements performed in standing and are designed to provide retraining effort needed for sustained muscle activation in tasks Performed as follows: 3) Step and reach forward, 4) Step and Reach Backwards, 5) Step and reach sideways, 6) Rock and reach forward/backward, 7) Rock and reach sideways. All standing exercises performed with CGA to minimal assist for balance and technique with the therapist. She continues to demo understanding of needing adaptive exercises for balance at home when she does not have therapist to provide assistance. Sit to stand from mat table on lowest setting with cues for weight shift, technique and CGA for 10 reps for 1 set. Patient performing reciprocal stepping exercise with CGA and cues for 10 reps each foot, stair negotiation 4 steps for 5 reps each while holding an  object in one hand and using hand rail for the other hand, cues for big turns, CGA and cues.   Other Exercises 2 Functional mobility with cues for amplitude of steps and reciprocal arm swing for 2 trials of 600 feet outdoors this date with sloped and winding pathways, small hills.  Continued focus on step length, height and reciprocal arm patterns in a more relaxed and normal fashion with cues                OT Education - 07/27/15 2239    Education provided Yes   Education Details HEP, arm positioning with exercises.    Person(s) Educated Patient   Methods Explanation;Demonstration;Verbal cues   Comprehension Verbal cues required;Returned demonstration;Verbalized understanding             OT Long Term Goals - 07/14/15 1209    OT LONG TERM GOAL #1   Title Patient will improve gait speed and endurance and be able to walk 700 feet in 6 minutes to negotiate around the home and community safely in 4 weeks   Baseline 535 feet for 6 minute walk test at eval   Time 4   Period Weeks   Status New   OT LONG TERM GOAL #2   Title Patient will complete HEP for maximal daily exercises with modified independence in 4 weeks   Baseline no current home program   Time 4   Period Weeks   Status New   OT LONG TERM GOAL #3   Title Patient will transfer from sit to stand without the use of arms safely and independently from a variety of chairs/surfaces in 4 weeks.    Baseline difficulty with lower and unstable surfaces, 5 times sit to stand 30 secs.    Time 4   Period Weeks   Status New   OT LONG TERM GOAL #4   Title Patient will decrease frequency of freezing episodes with score of 12 or less on Freezing of Gait    Baseline score of 15 at eval.   Time 4   Period Weeks   Status New   OT LONG TERM GOAL #5   Title Patient will complete buttons with modified independence for dressing tasks.    Baseline difficulty to complete   Time 4   Period Weeks   Status New               Plan - 07/27/15 2240    Clinical Impression Statement Patient has continued to make good progress, no loss of balances today, no episodes of tripping.  Outdoor walking this date so surfaces are different than indoors.  She continues to require cues and modeling at times for daily exercises especially for arm positioning.     Rehab Potential Good   OT Frequency 4x / week   OT Duration 4 weeks   OT Treatment/Interventions Self-care/ADL training;Therapeutic exercise;Functional Mobility Training;Patient/family  education;Neuromuscular education;Balance training;Therapeutic exercises;DME and/or AE instruction;Therapeutic activities;Gait Training;Cognitive remediation/compensation;Stair Training   Consulted and Agree with Plan of Care Patient      Patient will benefit from skilled therapeutic intervention in order to improve the following deficits and impairments:  Decreased endurance, Decreased knowledge of precautions, Decreased activity tolerance, Decreased knowledge of use of DME, Decreased strength, Decreased balance, Decreased mobility, Difficulty walking, Abnormal gait, Decreased cognition, Decreased coordination, Impaired UE functional use  Visit Diagnosis: Difficulty in walking, not elsewhere classified  Unsteadiness on feet  Muscle weakness (generalized)  Other lack of coordination  Problem List Patient Active Problem List   Diagnosis Date Noted  . Mild major depression (HCC) 12/20/2014  . Moderate major depression (HCC) 12/20/2014  . Neurosis, posttraumatic 12/20/2014  . Allergic rhinitis 12/20/2014  . Acid reflux 06/09/2013  . Clinical depression 06/09/2013  . Chronic obstructive pulmonary disease (HCC) 06/09/2013  . Benign essential tremor 06/09/2013  . Hypercholesterolemia 06/09/2013  . BP (high blood pressure) 06/09/2013  . Adaptive colitis 06/09/2013  . H/O renal calculi 06/09/2013  . Goiter, nontoxic, multinodular 06/09/2013  . Inflamed nasal mucosa 06/09/2013  . Asthma-chronic obstructive pulmonary disease overlap syndrome (HCC) 06/09/2013   Amy T Arne ClevelandLovett, OTR/L, CLT  Lovett,Amy 07/27/2015, 10:43 PM  [facility] MAIN [facility] [address] [place], [place], [address] Phone: [phone]   Fax:  [phone]  Name: Kelly Blackburn MRN: [id number] Date of Birth: 04/08/1934

## 2015-07-28 ENCOUNTER — Encounter: Payer: Self-pay | Admitting: Occupational Therapy

## 2015-07-28 ENCOUNTER — Ambulatory Visit: Payer: Medicare Other | Admitting: Occupational Therapy

## 2015-07-28 DIAGNOSIS — R278 Other lack of coordination: Secondary | ICD-10-CM

## 2015-07-28 DIAGNOSIS — R2681 Unsteadiness on feet: Secondary | ICD-10-CM

## 2015-07-28 DIAGNOSIS — R262 Difficulty in walking, not elsewhere classified: Secondary | ICD-10-CM

## 2015-07-28 DIAGNOSIS — M6281 Muscle weakness (generalized): Secondary | ICD-10-CM

## 2015-07-28 NOTE — Therapy (Signed)
[facility] MAIN [facility] SERVICES [address] [place], [place], [address] Phone: [phone]   Fax:  [phone]  Occupational Therapy Treatment  Patient Details  Name: Kelly Blackburn MRN: [id number] Date of Birth: 03-09-1934 No Data Recorded  Encounter Date: 07/28/2015      OT End of Session - 07/28/15 1025    Visit Number 9   Number of Visits 17   Date for OT Re-Evaluation 08/18/15   Authorization Type medicare G code 9   OT Start Time 1000   OT Stop Time 1100   OT Time Calculation (min) 60 min   Activity Tolerance Patient tolerated treatment well   Behavior During Therapy [facility] for tasks assessed/performed      Past Medical History  Diagnosis Date  . Depression   . PTSD (post-traumatic stress disorder)   . Thyroid disease   . Parkinson disease [facility])     Past Surgical History  Procedure Laterality Date  . Cholecystectomy    . Eye surgery      There were no vitals filed for this visit.      Subjective Assessment - 07/28/15 1024    Subjective  Patient reports she has had some increased tremors in her hands and around her mouth the last few days.  Went to bed around 1230 again last night and got up  early thinking her appt was at 9:00.     Pertinent History Patient reports she has had Parkinson's for several years now but is having more difficulty with moving around, balance and performing daily tasks. Her MD recommended she try LSVT BIG program to help improve her skills and independence.    Patient Stated Goals Patient reports she wants to be as independent as possible.     Currently in Pain? No/denies   Multiple Pain Sites No                      OT Treatments/Exercises (OP) - 07/28/15 1027    ADLs   ADL Comments Continued focus on functional component tasks as follows to increase independence in daily tasks:  sit to stand, management of curbs, negotiating up and down steps, handwriting skills for demographic  information and picking up small objects and placing into specific place without dropping.  Focused on numbers this date with handwriting for writing phone number. Cues for legibility and size   Neurological Re-education Exercises   Other Exercises 1 Patient seen for LSVT BIG exercises: LSVT Daily Session Maximal Daily Exercises: Sustained movements are designed to rescale the amplitude of movement output for generalization to daily functional activities. Performed as follows for 1 set of 10 repetitions each: Multi directional sustained movements- 1) Floor to ceiling, 2) Side to side. Multi directional Repetitive movements performed in standing and are designed to provide retraining effort needed for sustained muscle activation in tasks Performed as follows: 3) Step and reach forward, 4) Step and Reach Backwards, 5) Step and reach sideways, 6) Rock and reach forward/backward, 7) Rock and reach sideways. All standing exercises performed with CGA to minimal assist for balance and technique with the therapist. She continues to demo understanding of needing adaptive exercises for balance at home when she does not have therapist to provide assistance. Sit to stand from mat table on lowest setting with cues for weight shift, technique and CGA for 10 reps for 1 set. Patient performing reciprocal stepping exercise with CGA and cues for 10 reps each foot.  Other Exercises 2 Functional mobility with cues for amplitude of steps and reciprocal arm swing for 2 trials of 700 feet outdoors this date with sloped and winding pathways, small hills. Continued focus on step length, height and reciprocal arm patterns in a more relaxed and normal fashion with cues.  sit to stand from rocking chair, swing and park bench this date.                 OT Education - 07/28/15 1025    Education provided Yes   Education Details daily exercises   Person(s) Educated Patient   Methods Explanation;Demonstration;Verbal cues    Comprehension Verbal cues required;Returned demonstration;Verbalized understanding             OT Long Term Goals - 07/14/15 1209    OT LONG TERM GOAL #1   Title Patient will improve gait speed and endurance and be able to walk 700 feet in 6 minutes to negotiate around the home and community safely in 4 weeks   Baseline 535 feet for 6 minute walk test at eval   Time 4   Period Weeks   Status New   OT LONG TERM GOAL #2   Title Patient will complete HEP for maximal daily exercises with modified independence in 4 weeks   Baseline no current home program   Time 4   Period Weeks   Status New   OT LONG TERM GOAL #3   Title Patient will transfer from sit to stand without the use of arms safely and independently from a variety of chairs/surfaces in 4 weeks.    Baseline difficulty with lower and unstable surfaces, 5 times sit to stand 30 secs.    Time 4   Period Weeks   Status New   OT LONG TERM GOAL #4   Title Patient will decrease frequency of freezing episodes with score of 12 or less on Freezing of Gait    Baseline score of 15 at eval.   Time 4   Period Weeks   Status New   OT LONG TERM GOAL #5   Title Patient will complete buttons with modified independence for dressing tasks.    Baseline difficulty to complete   Time 4   Period Weeks   Status New               Plan - 07/28/15 1025    Clinical Impression Statement 2 LOB this date with exercises and required CGA for recovery, no LOB with ambulation outdoors this date.  She continues to progress in all areas, will plan to reassess 6 minute walk, 5 times sit to stand and FOG questionairre next visit and update goals.     Rehab Potential Good   OT Frequency 4x / week   OT Treatment/Interventions Self-care/ADL training;Therapeutic exercise;Functional Mobility Training;Patient/family education;Neuromuscular education;Balance training;Therapeutic exercises;DME and/or AE instruction;Therapeutic activities;Gait  Training;Cognitive remediation/compensation;Stair Training   Consulted and Agree with Plan of Care Patient      Patient will benefit from skilled therapeutic intervention in order to improve the following deficits and impairments:  Decreased endurance, Decreased knowledge of precautions, Decreased activity tolerance, Decreased knowledge of use of DME, Decreased strength, Decreased balance, Decreased mobility, Difficulty walking, Abnormal gait, Decreased cognition, Decreased coordination, Impaired UE functional use  Visit Diagnosis: Difficulty in walking, not elsewhere classified  Unsteadiness on feet  Muscle weakness (generalized)  Other lack of coordination    Problem List Patient Active Problem List   Diagnosis Date Noted  . Mild major  depression (HCC) 12/20/2014  . Moderate major depression (HCC) 12/20/2014  . Neurosis, posttraumatic 12/20/2014  . Allergic rhinitis 12/20/2014  . Acid reflux 06/09/2013  . Clinical depression 06/09/2013  . Chronic obstructive pulmonary disease (HCC) 06/09/2013  . Benign essential tremor 06/09/2013  . Hypercholesterolemia 06/09/2013  . BP (high blood pressure) 06/09/2013  . Adaptive colitis 06/09/2013  . H/O renal calculi 06/09/2013  . Goiter, nontoxic, multinodular 06/09/2013  . Inflamed nasal mucosa 06/09/2013  . Asthma-chronic obstructive pulmonary disease overlap syndrome (HCC) 06/09/2013   Davinia Riccardi T Aigner Horseman, OTR/L, CLT  Leara Rawl 07/28/2015, 11:04 AM  [facility] MAIN [facility] SERVICES [address] [place], [place], [address] Phone: [phone]   Fax:  [phone]  Name: SHABREKA COULON MRN: [id number] Date of Birth: 1934/03/17

## 2015-08-01 ENCOUNTER — Ambulatory Visit: Payer: Medicare Other | Admitting: Occupational Therapy

## 2015-08-02 ENCOUNTER — Encounter: Payer: Self-pay | Admitting: Occupational Therapy

## 2015-08-02 ENCOUNTER — Ambulatory Visit: Payer: Medicare Other | Admitting: Occupational Therapy

## 2015-08-02 DIAGNOSIS — M6281 Muscle weakness (generalized): Secondary | ICD-10-CM

## 2015-08-02 DIAGNOSIS — R2681 Unsteadiness on feet: Secondary | ICD-10-CM

## 2015-08-02 DIAGNOSIS — R262 Difficulty in walking, not elsewhere classified: Secondary | ICD-10-CM | POA: Diagnosis not present

## 2015-08-02 DIAGNOSIS — R278 Other lack of coordination: Secondary | ICD-10-CM

## 2015-08-02 NOTE — Therapy (Signed)
[facility] [address] [place], [place], [address] Phone: [phone]   Fax:  [phone]  Occupational Therapy Treatment  Patient Details  Name: Kelly Blackburn MRN: [id number] Date of Birth: 09/18/1934 No Data Recorded  Encounter Date: 08/02/2015    Past Medical History  Diagnosis Date  . Depression   . PTSD (post-traumatic stress disorder)   . Thyroid disease   . Parkinson disease [facility](HCC)     Past Surgical History  Procedure Laterality Date  . Cholecystectomy    . Eye surgery      There were no vitals filed for this visit.      Subjective Assessment - 08/02/15 2022    Subjective  Patient reports she was not feeling well yesterday and was not able to make it to therapy, upset stomach and not sure if it was something she ate.     Patient Stated Goals Patient reports she wants to be as independent as possible.     Currently in Pain? No/denies   Multiple Pain Sites No                      OT Treatments/Exercises (OP) - 08/02/15 2022    ADLs   ADL Comments functional component tasks as follows to increase independence in daily tasks: sit to stand, management of curbs, negotiating up and down steps, handwriting skills for demographic information and picking up small objects and placing into specific place without dropping. Focused on numbers this date with handwriting for writing phone number. Cues for legibility and size   Neurological Re-education Exercises   Other Exercises 1 Patient seen for LSVT BIG exercises: LSVT Daily Session Maximal Daily Exercises: Sustained movements are designed to rescale the amplitude of movement output for generalization to daily functional activities. Performed as follows for 1 set of 10 repetitions each: Multi directional sustained movements- 1) Floor to ceiling, 2) Side to side. Multi directional Repetitive movements performed in standing and are designed to provide  retraining effort needed for sustained muscle activation in tasks Performed as follows: 3) Step and reach forward, 4) Step and Reach Backwards, 5) Step and reach sideways, 6) Rock and reach forward/backward, 7) Rock and reach sideways. All standing exercises performed with CGA to minimal assist for balance and technique with the therapist. She continues to demo understanding of needing adaptive exercises for balance at home when she does not have therapist to provide assistance. Sit to stand from mat table on lowest setting with cues for weight shift, technique and CGA for 10 reps for 1 set. Patient performing reciprocal stepping exercise with CGA and cues for 10 reps each foot.    Other Exercises 2 Reassessment of outcome measures and goals, 5 times sit to stand 12 secs (30 secs at eval).  6 minute walk test 1260 feet (535 feet at eval)                     OT Long Term Goals - 07/14/15 1209    OT LONG TERM GOAL #1   Title Patient will improve gait speed and endurance and be able to walk 700 feet in 6 minutes to negotiate around the home and community safely in 4 weeks   Baseline 535 feet for 6 minute walk test at eval   Time 4   Period Weeks   Status New   OT LONG TERM GOAL #2   Title Patient will complete HEP for  maximal daily exercises with modified independence in 4 weeks   Baseline no current home program   Time 4   Period Weeks   Status New   OT LONG TERM GOAL #3   Title Patient will transfer from sit to stand without the use of arms safely and independently from a variety of chairs/surfaces in 4 weeks.    Baseline difficulty with lower and unstable surfaces, 5 times sit to stand 30 secs.    Time 4   Period Weeks   Status New   OT LONG TERM GOAL #4   Title Patient will decrease frequency of freezing episodes with score of 12 or less on Freezing of Gait    Baseline score of 15 at eval.   Time 4   Period Weeks   Status New   OT LONG TERM GOAL #5   Title Patient  will complete buttons with modified independence for dressing tasks.    Baseline difficulty to complete   Time 4   Period Weeks   Status New             Patient will benefit from skilled therapeutic intervention in order to improve the following deficits and impairments:     Visit Diagnosis: Difficulty in walking, not elsewhere classified  Muscle weakness (generalized)  Unsteadiness on feet  Other lack of coordination      G-Codes - 08/10/2015 07-10-12    Functional Assessment Tool Used clinical judgment, 6 minute walk test, 5 times sit to stand, BERG balance test, freezing of gait questionairre.   Functional Limitation Mobility: Walking and moving around   Mobility: Walking and Moving Around Current Status [phone]) At least 60 percent but less than 80 percent impaired, limited or restricted   Mobility: Walking and Moving Around Goal Status [phone]) At least 40 percent but less than 60 percent impaired, limited or restricted      Problem List Patient Active Problem List   Diagnosis Date Noted  . Mild major depression (HCC) 12/20/2014  . Moderate major depression (HCC) 12/20/2014  . Neurosis, posttraumatic 12/20/2014  . Allergic rhinitis 12/20/2014  . Acid reflux 06/09/2013  . Clinical depression 06/09/2013  . Chronic obstructive pulmonary disease (HCC) 06/09/2013  . Benign essential tremor 06/09/2013  . Hypercholesterolemia 06/09/2013  . BP (high blood pressure) 06/09/2013  . Adaptive colitis 06/09/2013  . H/O renal calculi 06/09/2013  . Goiter, nontoxic, multinodular 06/09/2013  . Inflamed nasal mucosa 06/09/2013  . Asthma-chronic obstructive pulmonary disease overlap syndrome (HCC) 06/09/2013    Lovett,Amy August 10, 2015, 9:15 PM  [facility] MAIN [facility] SERVICES [address] [place], [place], [address] Phone: [phone]   Fax:  [phone]  Name: Kelly Blackburn MRN: [id number] Date of Birth: [birth year]

## 2015-08-03 ENCOUNTER — Ambulatory Visit: Payer: Medicare Other | Attending: Neurology | Admitting: Occupational Therapy

## 2015-08-03 ENCOUNTER — Encounter: Payer: Self-pay | Admitting: Occupational Therapy

## 2015-08-03 DIAGNOSIS — R2681 Unsteadiness on feet: Secondary | ICD-10-CM | POA: Diagnosis present

## 2015-08-03 DIAGNOSIS — R278 Other lack of coordination: Secondary | ICD-10-CM | POA: Diagnosis present

## 2015-08-03 DIAGNOSIS — R262 Difficulty in walking, not elsewhere classified: Secondary | ICD-10-CM | POA: Diagnosis present

## 2015-08-03 DIAGNOSIS — M6281 Muscle weakness (generalized): Secondary | ICD-10-CM | POA: Insufficient documentation

## 2015-08-03 NOTE — Therapy (Signed)
[facility] [facility] MA[facility] [address] [place], [place], [address] Phone: [phone]   Fax:  [phone]  Occupational Therapy Treatment/Progress Note 07/12/2015 to 08/02/2015  Patient Details  Name: Kelly Blackburn MRN: [id number] Date of Birth: 07/22/1934 No Data Recorded  Encounter Date: 08/02/2015      OT End of Session - 08/03/15 1438    Visit Number 10   Number of Visits 17   Date for OT Re-Evaluation 08/18/15   Authorization Type medicare G code 10   OT Start Time 0930   OT Stop Time 1031   OT Time Calculation (min) 61 min   Activity Tolerance Patient tolerated treatment well   [facility] tasks assessed/performed      Past Medical History  Diagnosis Date  . Depression   . PTSD (post-traumatic stress disorder)   . Thyroid disease   . Parkinson disease [facility](HCC)     Past Surgical History  Procedure Laterality Date  . Cholecystectomy    . Eye surgery      There were no vitals filed for this visit.      Subjective Assessment - 08/02/15 2022    Subjective  Patient reports she was not feeling well yesterday and was not able to make it to therapy, upset stomach and not sure if it was something she ate.     Patient Stated Goals Patient reports she wants to be as independent as possible.     Currently in Pain? No/denies   Multiple Pain Sites No                      OT Treatments/Exercises (OP) - 08/02/15 2022    ADLs   ADL Comments functional component tasks as follows to increase independence in daily tasks: sit to stand, management of curbs, negotiating up and down steps, handwriting skills for demographic information and picking up small objects and placing into specific place without dropping. Focused on numbers this date with handwriting for writing phone number. Cues for legibility and size   Neurological Re-education Exercises   Other Exercises 1 Patient seen for LSVT BIG  exercises: LSVT Daily Session Maximal Daily Exercises: Sustained movements are designed to rescale the amplitude of movement output for generalization to daily functional activities. Performed as follows for 1 set of 10 repetitions each: Multi directional sustained movements- 1) Floor to ceiling, 2) Side to side. Multi directional Repetitive movements performed in standing and are designed to provide retraining effort needed for sustained muscle activation in tasks Performed as follows: 3) Step and reach forward, 4) Step and Reach Backwards, 5) Step and reach sideways, 6) Rock and reach forward/backward, 7) Rock and reach sideways. All standing exercises performed with CGA to minimal assist for balance and technique with the therapist. She continues to demo understanding of needing adaptive exercises for balance at home when she does not have therapist to provide assistance. Sit to stand from mat table on lowest setting with cues for weight shift, technique and CGA for 10 reps for 1 set. Patient performing reciprocal stepping exercise with CGA and cues for 10 reps each foot.    Other Exercises 2 Reassessment of outcome measures and goals, 5 times sit to stand 12 secs (30 secs at eval).  6 minute walk test 1260 feet (535 feet at eval)                OT Education - 08/02/15 1700  Education provided Yes   Education Details HEP, goals, progress   Person(s) Educated Patient   Methods Explanation;Demonstration;Verbal cues   Comprehension Verbal cues required;Returned demonstration;Verbalized understanding             OT Long Term Goals - 08/07/15 1700    OT LONG TERM GOAL #1   Title Patient will improve gait speed and endurance and be able to walk 1300 feet in 6 minutes to negotiate around the home and community safely in 4 weeks   Baseline 535 feet for 6 minute walk test at eval   Time 4   Period Weeks   Status Revised   OT LONG TERM GOAL #2   Title Patient will complete HEP for  maximal daily exercises with modified independence in 4 weeks   Time 4   Period Weeks   Status On-going   OT LONG TERM GOAL #3   Title Patient will transfer from sit to stand without the use of arms safely and independently from a variety of chairs/surfaces in 4 weeks.    Baseline difficulty with lower and unstable surfaces, 5 times sit to stand 30 secs at eval, 12 secs at 10th visit.   Time 4   Period Weeks   Status On-going   OT LONG TERM GOAL #4   Title Patient will decrease frequency of freezing episodes with score of 11 or less on Freezing of Gait    Baseline score of 15 at eval, score of 12 at 10th visit   Time 4   Period Weeks   Status Revised   OT LONG TERM GOAL #5   Title Patient will complete buttons with modified independence for dressing tasks.    Baseline difficulty to complete   Time 4   Period Weeks   Status On-going               Plan - 07-Aug-2015 1700    Clinical Impression Statement Patient reassessed this date with outcome measures, Her 5 times sit to stand improved from 30 secs to 12 secs, 6 minute walk test from 535 feet to 1260 feet this date on her 10th visit.  She has made remarkable progress with the intensive portion of LSVT BIG and will have 7 more visits to complete to focus on balance, freezing of gait patterns, and amplitude of movement.  She is excited about her progress and is working at home daily with Home exercises for carryover. Goals were updated to reflect current status and a couple were revised due to exceeding goal.    Rehab Potential Good   OT Frequency 4x / week   OT Duration 4 weeks   OT Treatment/Interventions Self-care/ADL training;Therapeutic exercise;Functional Mobility Training;Patient/family education;Neuromuscular education;Balance training;Therapeutic exercises;DME and/or AE instruction;Therapeutic activities;Gait Training;Cognitive remediation/compensation;Stair Training   Consulted and Agree with Plan of Care Patient       Patient will benefit from skilled therapeutic intervention in order to improve the following deficits and impairments:  Decreased endurance, Decreased knowledge of precautions, Decreased activity tolerance, Decreased knowledge of use of DME, Decreased strength, Decreased balance, Decreased mobility, Difficulty walking, Abnormal gait, Decreased cognition, Decreased coordination, Impaired UE functional use  Visit Diagnosis: Difficulty in walking, not elsewhere classified  Muscle weakness (generalized)  Unsteadiness on feet  Other lack of coordination      G-Codes - 08/07/2015 15-Jul-2112    Functional Assessment Tool Used clinical judgment, 6 minute walk test, 5 times sit to stand, BERG balance test, freezing of gait questionairre.   Functional  Limitation Mobility: Walking and moving around   Mobility: Walking and Moving Around Current Status [phone]) At least 60 percent but less than 80 percent impaired, limited or restricted   Mobility: Walking and Moving Around Goal Status [phone]) At least 40 percent but less than 60 percent impaired, limited or restricted      Problem List Patient Active Problem List   Diagnosis Date Noted  . Mild major depression (HCC) 12/20/2014  . Moderate major depression (HCC) 12/20/2014  . Neurosis, posttraumatic 12/20/2014  . Allergic rhinitis 12/20/2014  . Acid reflux 06/09/2013  . Clinical depression 06/09/2013  . Chronic obstructive pulmonary disease (HCC) 06/09/2013  . Benign essential tremor 06/09/2013  . Hypercholesterolemia 06/09/2013  . BP (high blood pressure) 06/09/2013  . Adaptive colitis 06/09/2013  . H/O renal calculi 06/09/2013  . Goiter, nontoxic, multinodular 06/09/2013  . Inflamed nasal mucosa 06/09/2013  . Asthma-chronic obstructive pulmonary disease overlap syndrome (HCC) 06/09/2013  Amy T Arne Cleveland, OTR/L, CLT   Lovett,Amy 08/03/2015, 2:46 PM  [facility] MAIN [facility] SERVICES [address]  [place], [place], [address] Phone: [phone]   Fax:  [phone]  Name: Kelly Blackburn MRN: [id number] Date of Birth: 1934-07-06

## 2015-08-04 ENCOUNTER — Ambulatory Visit: Payer: Medicare Other | Admitting: Occupational Therapy

## 2015-08-04 ENCOUNTER — Encounter: Payer: Self-pay | Admitting: Occupational Therapy

## 2015-08-04 DIAGNOSIS — M6281 Muscle weakness (generalized): Secondary | ICD-10-CM | POA: Diagnosis not present

## 2015-08-04 DIAGNOSIS — R278 Other lack of coordination: Secondary | ICD-10-CM

## 2015-08-04 DIAGNOSIS — R2681 Unsteadiness on feet: Secondary | ICD-10-CM

## 2015-08-04 DIAGNOSIS — R262 Difficulty in walking, not elsewhere classified: Secondary | ICD-10-CM

## 2015-08-04 NOTE — Therapy (Signed)
[facility] [address] [place], [place], [address] Phone: [phone]   Fax:  [phone]  Occupational Therapy Treatment  Patient Details  Name: Kelly Blackburn MRN: [id number] Date of Birth: 12/27/1934 No Data Recorded  Encounter Date: 08/04/2015      OT End of Session - 08/04/15 0950    Visit Number 12   Number of Visits 17   Date for OT Re-Evaluation 08/18/15   Authorization Type medicare G code 12   OT Start Time 0930   OT Stop Time 1025   OT Time Calculation (min) 55 min   Activity Tolerance Patient tolerated treatment well   Behavior During Therapy [facility] for tasks assessed/performed      Past Medical History  Diagnosis Date  . Depression   . PTSD (post-traumatic stress disorder)   . Thyroid disease   . Parkinson disease [facility](HCC)     Past Surgical History  Procedure Laterality Date  . Cholecystectomy    . Eye surgery      There were no vitals filed for this visit.      Subjective Assessment - 08/04/15 0948    Subjective  Patient reports she stayed up late last night on the computer.  Burgess EstelleYesterday was the one year anniversary of her husbands death.     Patient Stated Goals Patient reports she wants to be as independent as possible.     Currently in Pain? No/denies   Multiple Pain Sites No                      OT Treatments/Exercises (OP) - 08/04/15 1146    ADLs   ADL Comments Functional component tasks as follows: sit to stand, management of curbs, negotiating up and down steps, handwriting skills for demographic information and picking up small objects and placing into specific place without dropping. Handwriting focus this date was on her name and address.   Neurological Re-education Exercises   Other Exercises 1 Patient seen for LSVT BIG exercises: LSVT Daily Session Maximal Daily Exercises: Sustained movements are designed to rescale the amplitude of movement output for generalization to  daily functional activities. Performed as follows for 1 set of 10 repetitions each: Multi directional sustained movements- 1) Floor to ceiling, 2) Side to side. Multi directional Repetitive movements performed in standing and are designed to provide retraining effort needed for sustained muscle activation in tasks Performed as follows: 3) Step and reach forward, 4) Step and Reach Backwards, 5) Step and reach sideways, 6) Rock and reach forward/backward, 7) Rock and reach sideways. All standing exercises performed with CGA to minimal assist for balance and technique with the therapist. She continues to demo understanding of needing adaptive exercises for balance at home when she does not have therapist to provide assistance. Sit to stand from mat table on lowest setting with cues for weight shift, technique and SBA for 10 reps for 1 set. Patient performing reciprocal stepping exercise with SBA and cues for 10 reps each foot.    Other Exercises 2 Functional mobility this date outdoors in sloped areas, winding pathways, inclines as well as even ground, SBA and cues for implementing LSVT BIG principles into task, 2 sets of 600 feet this date with one rest break in the middle of task and one at the end. Balance tasks in standing with weight shifting and cues.                 OT  Education - 08/04/15 0949    Education provided Yes   Education Details maximal daily exercises, LSVT BIG principles   Person(s) Educated Patient   Methods Explanation;Demonstration;Verbal cues   Comprehension Verbal cues required;Returned demonstration;Verbalized understanding             OT Long Term Goals - 08/02/15 1700    OT LONG TERM GOAL #1   Title Patient will improve gait speed and endurance and be able to walk 1300 feet in 6 minutes to negotiate around the home and community safely in 4 weeks   Baseline 535 feet for 6 minute walk test at eval   Time 4   Period Weeks   Status Revised   OT LONG TERM GOAL  #2   Title Patient will complete HEP for maximal daily exercises with modified independence in 4 weeks   Time 4   Period Weeks   Status On-going   OT LONG TERM GOAL #3   Title Patient will transfer from sit to stand without the use of arms safely and independently from a variety of chairs/surfaces in 4 weeks.    Baseline difficulty with lower and unstable surfaces, 5 times sit to stand 30 secs at eval, 12 secs at 10th visit.   Time 4   Period Weeks   Status On-going   OT LONG TERM GOAL #4   Title Patient will decrease frequency of freezing episodes with score of 11 or less on Freezing of Gait    Baseline score of 15 at eval, score of 12 at 10th visit   Time 4   Period Weeks   Status Revised   OT LONG TERM GOAL #5   Title Patient will complete buttons with modified independence for dressing tasks.    Baseline difficulty to complete   Time 4   Period Weeks   Status On-going               Plan - 08/04/15 0951    Clinical Impression Statement Patient demonstrates understanding of needing to perform exercises 2 times a day and make it a priority at home.  She has made great progress but hasn't been as consistent at home on a daily basis.  She continues to benefit from intensive portion of LSVT BIG program to improve fucntional mobiliy, independence in daily tasks and to reduce fall risk.     Rehab Potential Good   OT Frequency 4x / week   OT Duration 4 weeks   OT Treatment/Interventions Self-care/ADL training;Therapeutic exercise;Functional Mobility Training;Patient/family education;Neuromuscular education;Balance training;Therapeutic exercises;DME and/or AE instruction;Therapeutic activities;Gait Training;Cognitive remediation/compensation;Stair Training   Consulted and Agree with Plan of Care Patient      Patient will benefit from skilled therapeutic intervention in order to improve the following deficits and impairments:  Decreased endurance, Decreased knowledge of  precautions, Decreased activity tolerance, Decreased knowledge of use of DME, Decreased strength, Decreased balance, Decreased mobility, Difficulty walking, Abnormal gait, Decreased cognition, Decreased coordination, Impaired UE functional use  Visit Diagnosis: Difficulty in walking, not elsewhere classified  Muscle weakness (generalized)  Unsteadiness on feet  Other lack of coordination    Problem List Patient Active Problem List   Diagnosis Date Noted  . Mild major depression (HCC) 12/20/2014  . Moderate major depression (HCC) 12/20/2014  . Neurosis, posttraumatic 12/20/2014  . Allergic rhinitis 12/20/2014  . Acid reflux 06/09/2013  . Clinical depression 06/09/2013  . Chronic obstructive pulmonary disease (HCC) 06/09/2013  . Benign essential tremor 06/09/2013  . Hypercholesterolemia 06/09/2013  .  BP (high blood pressure) 06/09/2013  . Adaptive colitis 06/09/2013  . H/O renal calculi 06/09/2013  . Goiter, nontoxic, multinodular 06/09/2013  . Inflamed nasal mucosa 06/09/2013  . Asthma-chronic obstructive pulmonary disease overlap syndrome (HCC) 06/09/2013   Seraphina Mitchner T Arne Cleveland, OTR/L, CLT  Kemontae Dunklee 08/04/2015, 11:49 AM  [place] [facility] MAIN [facility] SERVICES [address] [place], [place], [address] Phone: [phone]   Fax:  [phone]  Name: Kelly Blackburn MRN: [id number] Date of Birth: January 08, 1935

## 2015-08-04 NOTE — Therapy (Signed)
[place] [facility] MAIN [facility] SERVICES [address] [place], [place], [address] Phone: [phone]   Fax:  [phone]  Occupational Therapy Treatment  Patient Details  Name: Kelly Blackburn MRN: [id number] Date of Birth: Jul 05, 1934 No Data Recorded  Encounter Date: 08/03/2015      OT End of Session - 08/03/15 1624    Visit Number 11   Number of Visits 17   Date for OT Re-Evaluation 08/18/15   Authorization Type medicare G code 11   OT Start Time 0855   OT Stop Time 0956   OT Time Calculation (min) 61 min   Activity Tolerance Patient tolerated treatment well   Behavior During Therapy [facility] for tasks assessed/performed      Past Medical History  Diagnosis Date  . Depression   . PTSD (post-traumatic stress disorder)   . Thyroid disease   . Parkinson disease [facility])     Past Surgical History  Procedure Laterality Date  . Cholecystectomy    . Eye surgery      There were no vitals filed for this visit.      Subjective Assessment - 08/03/15 1623    Subjective  Patient reports she is feeling better, the Zenaida Niece come early today, she is planning to go grocery shopping with her daughter, concerned that her daughter rushes her to finish quickly.   Patient Stated Goals Patient reports she wants to be as independent as possible.     Currently in Pain? No/denies   Multiple Pain Sites No                      OT Treatments/Exercises (OP) - 08/03/15 1500    ADLs   ADL Comments Functional component tasks as follows: sit to stand, management of curbs, negotiating up and down steps, handwriting skills for demographic information and picking up small objects and placing into specific place without dropping.  Handwriting focus this date was on family names in order to write letters and cards with legible script.    Neurological Re-education Exercises   Other Exercises 1 the amplitude of movement output for generalization to daily functional  activities. Performed as follows for 1 set of 10 repetitions each: Multi directional sustained movements- 1) Floor to ceiling, 2) Side to side. Multi directional Repetitive movements performed in standing and are designed to provide retraining effort needed for sustained muscle activation in tasks Performed as follows: 3) Step and reach forward, 4) Step and Reach Backwards, 5) Step and reach sideways, 6) Rock and reach forward/backward, 7) Rock and reach sideways. All standing exercises performed with CGA to minimal assist for balance and technique with the therapist. She continues to demo understanding of needing adaptive exercises for balance at home when she does not have therapist to provide assistance. Sit to stand from mat table on lowest setting with cues for weight shift, technique and CGA for 10 reps for 1 set. Patient performing reciprocal stepping exercise with SBA and cues for 10 reps each foot. Stair negotiation of 5 steps with SBA this date and cues for turning behaviors and amplitude of turns.   Other Exercises 2 Functional mobility this date outdoors in sloped areas, winding pathways, inclines as well as even ground, SBA and cues for implementing LSVT BIG principles into task, 2 sets of 650 feet this date with one rest break in the middle of task and one at the end. Balance tasks in standing with weight shifting and cues.  OT Education - 08/03/15 1624    Education provided Yes   Education Details HEP   Person(s) Educated Patient   Methods Explanation;Demonstration;Verbal cues   Comprehension Verbal cues required;Returned demonstration;Verbalized understanding             OT Long Term Goals - 08/02/15 1700    OT LONG TERM GOAL #1   Title Patient will improve gait speed and endurance and be able to walk 1300 feet in 6 minutes to negotiate around the home and community safely in 4 weeks   Baseline 535 feet for 6 minute walk test at eval   Time 4   Period  Weeks   Status Revised   OT LONG TERM GOAL #2   Title Patient will complete HEP for maximal daily exercises with modified independence in 4 weeks   Time 4   Period Weeks   Status On-going   OT LONG TERM GOAL #3   Title Patient will transfer from sit to stand without the use of arms safely and independently from a variety of chairs/surfaces in 4 weeks.    Baseline difficulty with lower and unstable surfaces, 5 times sit to stand 30 secs at eval, 12 secs at 10th visit.   Time 4   Period Weeks   Status On-going   OT LONG TERM GOAL #4   Title Patient will decrease frequency of freezing episodes with score of 11 or less on Freezing of Gait    Baseline score of 15 at eval, score of 12 at 10th visit   Time 4   Period Weeks   Status Revised   OT LONG TERM GOAL #5   Title Patient will complete buttons with modified independence for dressing tasks.    Baseline difficulty to complete   Time 4   Period Weeks   Status On-going               Plan - 08/03/15 1624    Clinical Impression Statement Patient continues to make excellent progress in all areas with participation in LSVT BIG program.  She has continued to require cues and CGA for balance with maximal daily exercises but has progresses to SBA with stairs and reciprocal stepping motions.  Will continue to work towards increasing independence with daiy exercises, increasing complexity of tasks and calibrating movement patterns to reduce fall risk and improve functional mobility skills.    Rehab Potential Good   OT Frequency 4x / week   OT Duration 4 weeks   OT Treatment/Interventions Self-care/ADL training;Therapeutic exercise;Functional Mobility Training;Patient/family education;Neuromuscular education;Balance training;Therapeutic exercises;DME and/or AE instruction;Therapeutic activities;Gait Training;Cognitive remediation/compensation;Stair Training   Consulted and Agree with Plan of Care Patient      Patient will benefit  from skilled therapeutic intervention in order to improve the following deficits and impairments:  Decreased endurance, Decreased knowledge of precautions, Decreased activity tolerance, Decreased knowledge of use of DME, Decreased strength, Decreased balance, Decreased mobility, Difficulty walking, Abnormal gait, Decreased cognition, Decreased coordination, Impaired UE functional use  Visit Diagnosis: Muscle weakness (generalized)  Difficulty in walking, not elsewhere classified  Unsteadiness on feet  Other lack of coordination    Problem List Patient Active Problem List   Diagnosis Date Noted  . Mild major depression (HCC) 12/20/2014  . Moderate major depression (HCC) 12/20/2014  . Neurosis, posttraumatic 12/20/2014  . Allergic rhinitis 12/20/2014  . Acid reflux 06/09/2013  . Clinical depression 06/09/2013  . Chronic obstructive pulmonary disease (HCC) 06/09/2013  . Benign essential tremor 06/09/2013  . Hypercholesterolemia  06/09/2013  . BP (high blood pressure) 06/09/2013  . Adaptive colitis 06/09/2013  . H/O renal calculi 06/09/2013  . Goiter, nontoxic, multinodular 06/09/2013  . Inflamed nasal mucosa 06/09/2013  . Asthma-chronic obstructive pulmonary disease overlap syndrome (HCC) 06/09/2013   Amy T Arne ClevelandLovett, OTR/L, CLT  Lovett,Amy 08/04/2015, 9:24 AM  [place] [facility] [name] [place], [place], [address] Phone: [phone]   Fax:  [phone]  Name: Lanny HurstJoan E Chelf MRN: [id number] Date of Birth: 07/13/1934

## 2015-08-07 ENCOUNTER — Ambulatory Visit: Payer: Medicare Other | Admitting: Occupational Therapy

## 2015-08-07 DIAGNOSIS — R262 Difficulty in walking, not elsewhere classified: Secondary | ICD-10-CM

## 2015-08-07 DIAGNOSIS — R278 Other lack of coordination: Secondary | ICD-10-CM

## 2015-08-07 DIAGNOSIS — R2681 Unsteadiness on feet: Secondary | ICD-10-CM

## 2015-08-07 DIAGNOSIS — M6281 Muscle weakness (generalized): Secondary | ICD-10-CM | POA: Diagnosis not present

## 2015-08-08 ENCOUNTER — Ambulatory Visit: Payer: Medicare Other | Admitting: Occupational Therapy

## 2015-08-08 DIAGNOSIS — R278 Other lack of coordination: Secondary | ICD-10-CM

## 2015-08-08 DIAGNOSIS — R262 Difficulty in walking, not elsewhere classified: Secondary | ICD-10-CM

## 2015-08-08 DIAGNOSIS — M6281 Muscle weakness (generalized): Secondary | ICD-10-CM

## 2015-08-08 DIAGNOSIS — R2681 Unsteadiness on feet: Secondary | ICD-10-CM

## 2015-08-09 ENCOUNTER — Ambulatory Visit: Payer: Medicare Other | Admitting: Occupational Therapy

## 2015-08-09 DIAGNOSIS — R262 Difficulty in walking, not elsewhere classified: Secondary | ICD-10-CM

## 2015-08-09 DIAGNOSIS — M6281 Muscle weakness (generalized): Secondary | ICD-10-CM

## 2015-08-09 DIAGNOSIS — R278 Other lack of coordination: Secondary | ICD-10-CM

## 2015-08-09 DIAGNOSIS — R2681 Unsteadiness on feet: Secondary | ICD-10-CM

## 2015-08-09 NOTE — Therapy (Signed)
[place] [facility] [address] , [place], [address] Phone: [phone]   Fax:  [phone]  Occupational Therapy Treatment  Patient Details  Name: Kelly HurstJoan E Kolk MRN: [id number] Date of Birth: 08/14/1934 No Data Recorded  Encounter Date: 08/07/2015      OT End of Session - 08/08/15 0923    Visit Number 13   Number of Visits 17   Date for OT Re-Evaluation 08/18/15   Authorization Type medicare G code 13   OT Start Time 0900   OT Stop Time 1000   OT Time Calculation (min) 60 min   Activity Tolerance Patient tolerated treatment well   Behavior During Therapy [facility] for tasks assessed/performed      Past Medical History  Diagnosis Date  . Depression   . PTSD (post-traumatic stress disorder)   . Thyroid disease   . Parkinson disease [facility](HCC)     Past Surgical History  Procedure Laterality Date  . Cholecystectomy    . Eye surgery      There were no vitals filed for this visit.      Subjective Assessment - 08/08/15 0920    Subjective  Patient reports she will be leaving on June 16th to go visit family, her daughter will be coming to pick her [place]    Patient Stated Goals Patient reports she wants to be as independent as possible.     Multiple Pain Sites No                      OT Treatments/Exercises (OP) - 08/08/15 1000    ADLs   ADL Comments Functional component tasks as follows: sit to stand, management of curbs, negotiating up and down steps, handwriting skills for demographic information and picking up small objects and placing into specific place without dropping. Handwriting focus this date was on her name and address.   Neurological Re-education Exercises   Other Exercises 1 Patient seen for LSVT BIG exercises: LSVT Daily Session Maximal Daily Exercises: Sustained movements are designed to rescale the amplitude of movement output for generalization to daily  functional activities. Performed as follows for 1 set of 10 repetitions each: Multi directional sustained movements- 1) Floor to ceiling, 2) Side to side. Multi directional Repetitive movements performed in standing and are designed to provide retraining effort needed for sustained muscle activation in tasks Performed as follows: 3) Step and reach forward, 4) Step and Reach Backwards, 5) Step and reach sideways, 6) Rock and reach forward/backward, 7) Rock and reach sideways. All standing exercises performed with CGA to minimal assist for balance and technique with the therapist. She continues to demo understanding of needing adaptive exercises for balance at home when she does not have therapist to provide assistance. Sit to stand from mat table on lowest setting with cues for weight shift, technique and supervision for 10 reps for 1 set. Patient performing reciprocal stepping exercise with SBA and cues for 10 reps each foot.   Other Exercises 2 Focus this date on functional mobility in long hallway with tile/lineolum flooring which patient has the most difficulty managing.  She was able to navigate 800 feet with SBA to CGA, one loss of balance with misstep but able to self recover.  Cues and instruction on amplitude of step on the right side.                OT Education - 08/08/15 [id number]  Education provided Yes   Education Details maximal daily exercises, HEP   Person(s) Educated Patient   Methods Explanation;Demonstration;Verbal cues   Comprehension Verbal cues required;Returned demonstration;Verbalized understanding             OT Long Term Goals - 08/02/15 1700    OT LONG TERM GOAL #1   Title Patient will improve gait speed and endurance and be able to walk 1300 feet in 6 minutes to negotiate around the home and community safely in 4 weeks   Baseline 535 feet for 6 minute walk test at eval   Time 4   Period Weeks   Status Revised   OT LONG TERM GOAL #2   Title Patient will  complete HEP for maximal daily exercises with modified independence in 4 weeks   Time 4   Period Weeks   Status On-going   OT LONG TERM GOAL #3   Title Patient will transfer from sit to stand without the use of arms safely and independently from a variety of chairs/surfaces in 4 weeks.    Baseline difficulty with lower and unstable surfaces, 5 times sit to stand 30 secs at eval, 12 secs at 10th visit.   Time 4   Period Weeks   Status On-going   OT LONG TERM GOAL #4   Title Patient will decrease frequency of freezing episodes with score of 11 or less on Freezing of Gait    Baseline score of 15 at eval, score of 12 at 10th visit   Time 4   Period Weeks   Status Revised   OT LONG TERM GOAL #5   Title Patient will complete buttons with modified independence for dressing tasks.    Baseline difficulty to complete   Time 4   Period Weeks   Status On-going               Plan - 08/08/15 0959    Clinical Impression Statement Patient is nearing the end of her program this and is planning to go visit family in [place] the following week.  She reports she is better prepared for travel and walking and feels she walks much better and not as worried about falling.  Focus this week is on ambulation in area of tiled/lineolum flooring with step through on the right , increased amplitude to avoid stubbing toe and tripping.  She was able to complete this date 800 feet on this surface with on incident of tripping on the right and able to self recover, therapist providing CGA.  Patient continues to require cues with daily exercises and often needs her pictorial instructions and does not appear ready to advancement of these exercises, she is still using the chair at home for select exercises.    Rehab Potential Good   OT Frequency 4x / week   OT Duration 4 weeks   OT Treatment/Interventions Self-care/ADL training;Therapeutic exercise;Functional Mobility Training;Patient/family  education;Neuromuscular education;Balance training;Therapeutic exercises;DME and/or AE instruction;Therapeutic activities;Gait Training;Cognitive remediation/compensation;Stair Training   Consulted and Agree with Plan of Care Patient      Patient will benefit from skilled therapeutic intervention in order to improve the following deficits and impairments:  Decreased endurance, Decreased knowledge of precautions, Decreased activity tolerance, Decreased knowledge of use of DME, Decreased strength, Decreased balance, Decreased mobility, Difficulty walking, Abnormal gait, Decreased cognition, Decreased coordination, Impaired UE functional use  Visit Diagnosis: Difficulty in walking, not elsewhere classified  Muscle weakness (generalized)  Unsteadiness on feet  Other lack of coordination  Problem List Patient Active Problem List   Diagnosis Date Noted  . Mild major depression (HCC) 12/20/2014  . Moderate major depression (HCC) 12/20/2014  . Neurosis, posttraumatic 12/20/2014  . Allergic rhinitis 12/20/2014  . Acid reflux 06/09/2013  . Clinical depression 06/09/2013  . Chronic obstructive pulmonary disease (HCC) 06/09/2013  . Benign essential tremor 06/09/2013  . Hypercholesterolemia 06/09/2013  . BP (high blood pressure) 06/09/2013  . Adaptive colitis 06/09/2013  . H/O renal calculi 06/09/2013  . Goiter, nontoxic, multinodular 06/09/2013  . Inflamed nasal mucosa 06/09/2013  . Asthma-chronic obstructive pulmonary disease overlap syndrome (HCC) 06/09/2013   Josedaniel Haye T Arne Cleveland, OTR/L, CLT  Raydan Schlabach 08/09/2015, 10:48 AM  [place] [facility] MAIN [facility] SERVICES [address] [place], [place], [address] Phone: [phone]   Fax:  [phone]  Name: YAZHINI MCAULAY MRN: [id number] Date of Birth: 1934-04-18

## 2015-08-10 ENCOUNTER — Ambulatory Visit: Payer: Medicare Other | Admitting: Occupational Therapy

## 2015-08-10 DIAGNOSIS — M6281 Muscle weakness (generalized): Secondary | ICD-10-CM | POA: Diagnosis not present

## 2015-08-10 DIAGNOSIS — R262 Difficulty in walking, not elsewhere classified: Secondary | ICD-10-CM

## 2015-08-10 DIAGNOSIS — R2681 Unsteadiness on feet: Secondary | ICD-10-CM

## 2015-08-10 DIAGNOSIS — R278 Other lack of coordination: Secondary | ICD-10-CM

## 2015-08-11 ENCOUNTER — Encounter: Payer: Self-pay | Admitting: Occupational Therapy

## 2015-08-11 NOTE — Therapy (Signed)
[facility] MAIN [facility] SERVICES [address] [place], [place], [address] Phone: [phone]   Fax:  [phone]  Occupational Therapy Treatment  Patient Details  Name: MAKAILAH SLAVICK MRN: [id number] Date of Birth: 1934-05-25 No Data Recorded  Encounter Date: 08/08/2015      OT End of Session - 08/11/15 1140    Visit Number 14   Number of Visits 17   Date for OT Re-Evaluation 08/18/15   Authorization Type medicare G code 14   OT Start Time 0857   OT Stop Time 0959   OT Time Calculation (min) 62 min   Activity Tolerance Patient tolerated treatment well   Behavior During Therapy [facility] for tasks assessed/performed      Past Medical History  Diagnosis Date  . Depression   . PTSD (post-traumatic stress disorder)   . Thyroid disease   . Parkinson disease [facility])     Past Surgical History  Procedure Laterality Date  . Cholecystectomy    . Eye surgery      There were no vitals filed for this visit.      Subjective Assessment - 08/10/15 1133    Subjective  Patient reports she has been doing her exercises consistently this week and is a little sore today.    Patient Stated Goals Patient reports she wants to be as independent as possible.     Currently in Pain? Yes   Pain Score 2    Pain Location Leg   Pain Orientation Left;Right   Pain Descriptors / Indicators Aching   Pain Type Acute pain   Multiple Pain Sites No                      OT Treatments/Exercises (OP) - 08/10/15 1134    ADLs   ADL Comments Continued focus on functional component tasks this date as follows:  sit to stand, management of curbs, negotiating up and down steps, handwriting skills for demographic information and picking up small objects and placing into specific place without dropping. Handwriting focus this date was formulating lists of things she will need to do prior to her leaving for [place] next week.   Neurological Re-education Exercises   Other Exercises 1 LSVT Daily Session Maximal Daily Exercises: Sustained movements are designed to rescale the amplitude of movement output for generalization to daily functional activities. Performed as follows for 1 set of 10 repetitions each: Multi directional sustained movements- 1) Floor to ceiling, 2) Side to side. Multi directional Repetitive movements performed in standing and are designed to provide retraining effort needed for sustained muscle activation in tasks Performed as follows: 3) Step and reach forward, 4) Step and Reach Backwards, 5) Step and reach sideways, 6) Rock and reach forward/backward, 7) Rock and reach sideways. All standing exercises performed with SBA and occasional CGA for balance and technique with the therapist. Patient performing reciprocal stepping exercise with supervision and cues for 10 reps each foot.   Other Exercises 2 Functional mobility in long hallway with tile/linoleum flooring which patient has the most difficulty managing. She was able to navigate 1000 feet with SBA, no loss of balance this date. Cues and instruction on amplitude of step on the right side. Continued focus on distractibility and calibrating movement patterns.                 OT Education - 08/10/15 1140    Education provided Yes   Education Details HEP, amplitude of gait  Person(s) Educated Patient   Methods Explanation;Demonstration;Verbal cues   Comprehension Verbal cues required;Returned demonstration;Verbalized understanding             OT Long Term Goals - 08/02/15 1700    OT LONG TERM GOAL #1   Title Patient will improve gait speed and endurance and be able to walk 1300 feet in 6 minutes to negotiate around the home and community safely in 4 weeks   Baseline 535 feet for 6 minute walk test at eval   Time 4   Period Weeks   Status Revised   OT LONG TERM GOAL #2   Title Patient will complete HEP for maximal daily exercises with modified independence in 4 weeks    Time 4   Period Weeks   Status On-going   OT LONG TERM GOAL #3   Title Patient will transfer from sit to stand without the use of arms safely and independently from a variety of chairs/surfaces in 4 weeks.    Baseline difficulty with lower and unstable surfaces, 5 times sit to stand 30 secs at eval, 12 secs at 10th visit.   Time 4   Period Weeks   Status On-going   OT LONG TERM GOAL #4   Title Patient will decrease frequency of freezing episodes with score of 11 or less on Freezing of Gait    Baseline score of 15 at eval, score of 12 at 10th visit   Time 4   Period Weeks   Status Revised   OT LONG TERM GOAL #5   Title Patient will complete buttons with modified independence for dressing tasks.    Baseline difficulty to complete   Time 4   Period Weeks   Status On-going               Plan - 08/11/15 1141    Clinical Impression Statement Patient has continued to make excellent progress and working towards being independent with home program, improved balance and managing tiled/linoelum floors. She is preparing for discharge by the end of this week.  She will need to continue with daily exercise.    Rehab Potential Good   OT Frequency 4x / week   OT Duration 4 weeks   OT Treatment/Interventions Self-care/ADL training;Therapeutic exercise;Functional Mobility Training;Patient/family education;Neuromuscular education;Balance training;Therapeutic exercises;DME and/or AE instruction;Therapeutic activities;Gait Training;Cognitive remediation/compensation;Stair Training   Consulted and Agree with Plan of Care Patient      Patient will benefit from skilled therapeutic intervention in order to improve the following deficits and impairments:  Decreased endurance, Decreased knowledge of precautions, Decreased activity tolerance, Decreased knowledge of use of DME, Decreased strength, Decreased balance, Decreased mobility, Difficulty walking, Abnormal gait, Decreased cognition, Decreased  coordination, Impaired UE functional use  Visit Diagnosis: Muscle weakness (generalized)  Difficulty in walking, not elsewhere classified  Unsteadiness on feet  Other lack of coordination    Problem List Patient Active Problem List   Diagnosis Date Noted  . Mild major depression (HCC) 12/20/2014  . Moderate major depression (HCC) 12/20/2014  . Neurosis, posttraumatic 12/20/2014  . Allergic rhinitis 12/20/2014  . Acid reflux 06/09/2013  . Clinical depression 06/09/2013  . Chronic obstructive pulmonary disease (HCC) 06/09/2013  . Benign essential tremor 06/09/2013  . Hypercholesterolemia 06/09/2013  . BP (high blood pressure) 06/09/2013  . Adaptive colitis 06/09/2013  . H/O renal calculi 06/09/2013  . Goiter, nontoxic, multinodular 06/09/2013  . Inflamed nasal mucosa 06/09/2013  . Asthma-chronic obstructive pulmonary disease overlap syndrome (HCC) 06/09/2013  Isamar Wellbrock T Danny Zimny, OTR/L,  CLT   Alle Difabio 08/11/2015, 11:44 AM  [facility] MAIN [facility] SERVICES [address] [place], [place], [address] Phone: [phone]   Fax:  [phone]  Name: DELARA SHEPHEARD MRN: [id number] Date of Birth: [birth year]

## 2015-08-11 NOTE — Therapy (Signed)
[place] MAIN [facility] SERVICES [address] [place], [place], [address] Phone: [phone]   Fax:  [phone]  Occupational Therapy Treatment/Discharge Summary 07/12/2015 to 08/10/2015  Patient Details  Name: Kelly Blackburn MRN: [id number] Date of Birth: 1934/04/22 No Data Recorded  Encounter Date: 08/10/2015      OT End of Session - 08/11/15 1520    Visit Number 16   Number of Visits 17   Date for OT Re-Evaluation 08/18/15   Authorization Type medicare G code 16   OT Start Time 0900   OT Stop Time 1000   OT Time Calculation (min) 60 min   Activity Tolerance Patient tolerated treatment well   Behavior During Therapy [facility] for tasks assessed/performed      Past Medical History  Diagnosis Date  . Depression   . PTSD (post-traumatic stress disorder)   . Thyroid disease   . Parkinson disease [facility])     Past Surgical History  Procedure Laterality Date  . Cholecystectomy    . Eye surgery      There were no vitals filed for this visit.      Subjective Assessment - 08/11/15 1514    Subjective  Patient reports she is pleased with her progress, feels much better and more prepared to take a trip home to see her family next week.  She reports she will continue with exercises on a daily basis.   Patient Stated Goals Patient reports she wants to be as independent as possible.     Currently in Pain? No/denies   Pain Score 0-No pain                      OT Treatments/Exercises (OP) - 08/11/15 1515    ADLs   ADL Comments Final focus on functional component tasks and implementation into hierarchy tasks successfully and independent with all tasks.    Neurological Re-education Exercises   Other Exercises 1 LSVT Daily Session Maximal Daily Exercises: Sustained movements are designed to rescale the amplitude of movement output for generalization to daily functional activities. Performed as follows for 1 set of 10 repetitions each:  Multi directional sustained movements- 1) Floor to ceiling, 2) Side to side. Multi directional Repetitive movements performed in standing and are designed to provide retraining effort needed for sustained muscle activation in tasks Performed as follows: 3) Step and reach forward, 4) Step and Reach Backwards, 5) Step and reach sideways, 6) Rock and reach forward/backward, 7) Rock and reach sideways. All standing exercises performed with supervision for balance and occasional cues for technique.  Patient performing reciprocal stepping exercise independently for 10 reps each foot.  Issued additional packet of exercises to take with her on her upcoming trip and instructed on both standard exercises when she has someone there to assist and adaptive exercises when she is alone.    Other Exercises 2 6 minute walk test 1275 feet, 5 times sit to stand 10 secs, Berg balance test 51/56. Freezing of gait questionairre 10.                OT Education - 08/11/15 1519    Education provided Yes   Education Details HEP for both standard and adapted exercises   Person(s) Educated Patient   Methods Explanation;Demonstration;Verbal cues   Comprehension Returned demonstration;Verbalized understanding;Verbal cues required             OT Long Term Goals - 08/11/15 1527    OT LONG TERM GOAL #  1   Title Patient will improve gait speed and endurance and be able to walk 1300 feet in 6 minutes to negotiate around the home and community safely in 4 weeks   Baseline 535 feet for 6 minute walk test at eval, 1275 feet at discharge   Time 4   Period Weeks   Status Achieved   OT LONG TERM GOAL #2   Title Patient will complete HEP for maximal daily exercises with modified independence in 4 weeks   Time 4   Period Weeks   Status Achieved   OT LONG TERM GOAL #3   Title Patient will transfer from sit to stand without the use of arms safely and independently from a variety of chairs/surfaces in 4 weeks.     Baseline difficulty with lower and unstable surfaces, 5 times sit to stand 30 secs at eval, 12 secs at 10th visit, 10 secs at discharge   Time 4   Period Weeks   Status Achieved   OT LONG TERM GOAL #4   Title Patient will decrease frequency of freezing episodes with score of 11 or less on Freezing of Gait    Baseline score of 15 at eval, score of 12 at 10th visit, score of 10 at discharge.   Time 4   Period Weeks   Status Achieved   OT LONG TERM GOAL #5   Title Patient will complete buttons with modified independence for dressing tasks.    Time 4   Period Weeks   Status Achieved               Plan - 08/11/15 1520    Clinical Impression Statement Patient has completed the intensive portion of the LSVT BIG program and has met and exceeded her goals, she made excellent and remarkable progress in all areas.  All outcome measures were significant and patient has demonstrated carry over into daily routine and family has noticed the change in her functional mobility and balance during shopping trips. She may benefit from therapy in the future as her disease progresses.    Rehab Potential Good   OT Frequency 4x / week   OT Duration 4 weeks   OT Treatment/Interventions Self-care/ADL training;Therapeutic exercise;Functional Mobility Training;Patient/family education;Neuromuscular education;Balance training;Therapeutic exercises;DME and/or AE instruction;Therapeutic activities;Gait Training;Cognitive remediation/compensation;Stair Training   Consulted and Agree with Plan of Care Patient      Patient will benefit from skilled therapeutic intervention in order to improve the following deficits and impairments:  Decreased endurance, Decreased knowledge of precautions, Decreased activity tolerance, Decreased knowledge of use of DME, Decreased strength, Decreased balance, Decreased mobility, Difficulty walking, Abnormal gait, Decreased cognition, Decreased coordination, Impaired UE functional  use  Visit Diagnosis: Difficulty in walking, not elsewhere classified  Muscle weakness (generalized)  Unsteadiness on feet  Other lack of coordination      G-Codes - 2015/08/11 1528    Functional Assessment Tool Used clinical judgment, 6 minute walk test, 5 times sit to stand, BERG balance test, freezing of gait questionairre.   Functional Limitation Mobility: Walking and moving around   Mobility: Walking and Moving Around Goal Status [phone]) At least 40 percent but less than 60 percent impaired, limited or restricted   Mobility: Walking and Moving Around Discharge Status [phone]) At least 20 percent but less than 40 percent impaired, limited or restricted      Problem List Patient Active Problem List   Diagnosis Date Noted  . Mild major depression ([place]) 12/20/2014  . Moderate major depression ([place])  12/20/2014  . Neurosis, posttraumatic 12/20/2014  . Allergic rhinitis 12/20/2014  . Acid reflux 06/09/2013  . Clinical depression 06/09/2013  . Chronic obstructive pulmonary disease ([place]) 06/09/2013  . Benign essential tremor 06/09/2013  . Hypercholesterolemia 06/09/2013  . BP (high blood pressure) 06/09/2013  . Adaptive colitis 06/09/2013  . H/O renal calculi 06/09/2013  . Goiter, nontoxic, multinodular 06/09/2013  . Inflamed nasal mucosa 06/09/2013  . Asthma-chronic obstructive pulmonary disease overlap syndrome ([place]) 06/09/2013   Jaimie Redditt T Ozell Ferrera, OTR/L, CLT  Charleene Callegari 08/11/2015, 3:29 PM  [place] MAIN [facility] SERVICES [address] [place], [place], [address] Phone: [phone]   Fax:  [phone]  Name: Kelly Blackburn MRN: [id number] Date of Birth: 14-Oct-1934

## 2015-08-11 NOTE — Therapy (Signed)
[facility] MAIN [facility] [address] [place] [place], [place], [address] Phone: [phone]   Fax:  [phone]  Occupational Therapy Treatment  Patient Details  Name: Kelly Blackburn MRN: [id number] Date of Birth: 10/05/1934 No Data Recorded  Encounter Date: 08/09/2015      OT End of Session - 08/11/15 1339    Visit Number 15   Number of Visits 17   Date for OT Re-Evaluation 08/18/15   Authorization Type medicare G code 15   OT Start Time 0925   OT Stop Time 1026   OT Time Calculation (min) 61 min   Activity Tolerance Patient tolerated treatment well   Behavior During Therapy [facility] for tasks assessed/performed      Past Medical History  Diagnosis Date  . Depression   . PTSD (post-traumatic stress disorder)   . Thyroid disease   . Parkinson disease [facility](HCC)     Past Surgical History  Procedure Laterality Date  . Cholecystectomy    . Eye surgery      There were no vitals filed for this visit.      Subjective Assessment - 08/10/15 1328    Subjective  Patient reports she went to the store with her daughter and did well keeping up with the tasks, her daughter noticed and made a comment on how well she was walking.   Patient Stated Goals Patient reports she wants to be as independent as possible.     Currently in Pain? No/denies   Pain Score 0-No pain                      OT Treatments/Exercises (OP) - 08/10/15 1329    ADLs   ADL Comments Implementation of functional component tasks this date as follows: sit to stand, management of curbs, negotiating up and down steps, handwriting skills for demographic information and picking up small objects and placing into specific place without dropping. Handwriting focus this date was formulating lists of things to purchase at the store prior to her trip.   Neurological Re-education Exercises   Other Exercises 1 LSVT Daily Session Maximal Daily Exercises: Sustained movements are  designed to rescale the amplitude of movement output for generalization to daily functional activities. Performed as follows for 1 set of 10 repetitions each: Multi directional sustained movements- 1) Floor to ceiling, 2) Side to side. Multi directional Repetitive movements performed in standing and are designed to provide retraining effort needed for sustained muscle activation in tasks Performed as follows: 3) Step and reach forward, 4) Step and Reach Backwards, 5) Step and reach sideways, 6) Rock and reach forward/backward, 7) Rock and reach sideways. All standing exercises performed with SBA and occasional CGA for balance and technique with the therapist. Patient performing reciprocal stepping exercise with supervision and cues for 10 reps each foot.   Other Exercises 2 Functional mobility in long hallway with tile/linoleum flooring which patient has the most difficulty managing. She was able to navigate 925 feet with SBA, no loss of balance this date.                 OT Education - 08/11/15 1337    Education provided Yes   Education Details HEP   Person(s) Educated Patient   Methods Explanation;Demonstration;Verbal cues   Comprehension Verbal cues required;Returned demonstration;Verbalized understanding             OT Long Term Goals - 08/02/15 1700    OT LONG TERM GOAL #  1   Title Patient will improve gait speed and endurance and be able to walk 1300 feet in 6 minutes to negotiate around the home and community safely in 4 weeks   Baseline 535 feet for 6 minute walk test at eval   Time 4   Period Weeks   Status Revised   OT LONG TERM GOAL #2   Title Patient will complete HEP for maximal daily exercises with modified independence in 4 weeks   Time 4   Period Weeks   Status On-going   OT LONG TERM GOAL #3   Title Patient will transfer from sit to stand without the use of arms safely and independently from a variety of chairs/surfaces in 4 weeks.    Baseline difficulty  with lower and unstable surfaces, 5 times sit to stand 30 secs at eval, 12 secs at 10th visit.   Time 4   Period Weeks   Status On-going   OT LONG TERM GOAL #4   Title Patient will decrease frequency of freezing episodes with score of 11 or less on Freezing of Gait    Baseline score of 15 at eval, score of 12 at 10th visit   Time 4   Period Weeks   Status Revised   OT LONG TERM GOAL #5   Title Patient will complete buttons with modified independence for dressing tasks.    Baseline difficulty to complete   Time 4   Period Weeks   Status On-going               Plan - 08/11/15 1338    Clinical Impression Statement Patient continues to work towards primary difficulty of walking on lineolum flooring and focusing on amplitude of gait and step through with her right foot.  No loss of balances for the last 2 days in the clinic.  Will plan to discharge next date and provide extra copies of exercises for her to take with her on her trip, she will be gone for several months to stay with family.   Rehab Potential Good   OT Frequency 4x / week   OT Duration 4 weeks   OT Treatment/Interventions Self-care/ADL training;Therapeutic exercise;Functional Mobility Training;Patient/family education;Neuromuscular education;Balance training;Therapeutic exercises;DME and/or AE instruction;Therapeutic activities;Gait Training;Cognitive remediation/compensation;Stair Training   Consulted and Agree with Plan of Care Patient      Patient will benefit from skilled therapeutic intervention in order to improve the following deficits and impairments:  Decreased endurance, Decreased knowledge of precautions, Decreased activity tolerance, Decreased knowledge of use of DME, Decreased strength, Decreased balance, Decreased mobility, Difficulty walking, Abnormal gait, Decreased cognition, Decreased coordination, Impaired UE functional use  Visit Diagnosis: Difficulty in walking, not elsewhere classified  Muscle  weakness (generalized)  Unsteadiness on feet  Other lack of coordination    Problem List Patient Active Problem List   Diagnosis Date Noted  . Mild major depression (HCC) 12/20/2014  . Moderate major depression (HCC) 12/20/2014  . Neurosis, posttraumatic 12/20/2014  . Allergic rhinitis 12/20/2014  . Acid reflux 06/09/2013  . Clinical depression 06/09/2013  . Chronic obstructive pulmonary disease (HCC) 06/09/2013  . Benign essential tremor 06/09/2013  . Hypercholesterolemia 06/09/2013  . BP (high blood pressure) 06/09/2013  . Adaptive colitis 06/09/2013  . H/O renal calculi 06/09/2013  . Goiter, nontoxic, multinodular 06/09/2013  . Inflamed nasal mucosa 06/09/2013  . Asthma-chronic obstructive pulmonary disease overlap syndrome (HCC) 06/09/2013   Amy T Lovett, OTR/L, CLT  Lovett,Amy 08/11/2015, 1:42 PM  [place] [facility] MAIN [facility] SERVICES [address] [place], [place], [address] Phone: [phone]   Fax:  [phone]  Name: Kelly Blackburn MRN: [id number] Date of Birth: [birth year]

## 2015-08-16 ENCOUNTER — Ambulatory Visit: Payer: Medicare Other | Admitting: Licensed Clinical Social Worker

## 2015-08-18 ENCOUNTER — Emergency Department: Payer: Medicare Other

## 2015-08-18 ENCOUNTER — Encounter: Payer: Self-pay | Admitting: Emergency Medicine

## 2015-08-18 ENCOUNTER — Emergency Department
Admission: EM | Admit: 2015-08-18 | Discharge: 2015-08-19 | Disposition: A | Discharge status: Home / Self Care | Payer: Medicare Other | Attending: Emergency Medicine | Admitting: Emergency Medicine

## 2015-08-18 DIAGNOSIS — Z79899 Other long term (current) drug therapy: Secondary | ICD-10-CM | POA: Diagnosis not present

## 2015-08-18 DIAGNOSIS — W108XXA Fall (on) (from) other stairs and steps, initial encounter: Secondary | ICD-10-CM | POA: Diagnosis not present

## 2015-08-18 DIAGNOSIS — F329 Major depressive disorder, single episode, unspecified: Secondary | ICD-10-CM | POA: Insufficient documentation

## 2015-08-18 DIAGNOSIS — Y92511 Restaurant or cafe as the place of occurrence of the external cause: Secondary | ICD-10-CM | POA: Insufficient documentation

## 2015-08-18 DIAGNOSIS — J45909 Unspecified asthma, uncomplicated: Secondary | ICD-10-CM | POA: Insufficient documentation

## 2015-08-18 DIAGNOSIS — J449 Chronic obstructive pulmonary disease, unspecified: Secondary | ICD-10-CM | POA: Insufficient documentation

## 2015-08-18 DIAGNOSIS — Y9301 Activity, walking, marching and hiking: Secondary | ICD-10-CM | POA: Diagnosis not present

## 2015-08-18 DIAGNOSIS — Y999 Unspecified external cause status: Secondary | ICD-10-CM | POA: Diagnosis not present

## 2015-08-18 DIAGNOSIS — Z87891 Personal history of nicotine dependence: Secondary | ICD-10-CM | POA: Diagnosis not present

## 2015-08-18 DIAGNOSIS — G20 Parkinson's disease: Secondary | ICD-10-CM | POA: Insufficient documentation

## 2015-08-18 DIAGNOSIS — S42202A Unspecified fracture of upper end of left humerus, initial encounter for closed fracture: Secondary | ICD-10-CM

## 2015-08-18 DIAGNOSIS — S0990XA Unspecified injury of head, initial encounter: Secondary | ICD-10-CM | POA: Diagnosis present

## 2015-08-18 MED ORDER — ONDANSETRON HCL 4 MG/2ML IJ SOLN
INTRAMUSCULAR | Status: AC
  Administered 2015-08-18: 4 mg
  Filled 2015-08-18: qty 2

## 2015-08-18 MED ORDER — FENTANYL CITRATE (PF) 100 MCG/2ML IJ SOLN
75.0000 ug | Freq: Once | INTRAMUSCULAR | Status: AC
  Administered 2015-08-18: 75 ug via INTRAVENOUS
  Filled 2015-08-18: qty 2

## 2015-08-18 MED ORDER — OXYCODONE-ACETAMINOPHEN 5-325 MG PO TABS: 1.0000 | ORAL_TABLET | Freq: Four times a day (QID) | ORAL | Status: AC | PRN

## 2015-08-18 MED ORDER — ONDANSETRON 4 MG PO TBDP: 4.0000 mg | ORAL_TABLET | Freq: Three times a day (TID) | ORAL | Status: AC | PRN

## 2015-08-18 NOTE — ED Notes (Signed)
Pt arrived via ems from [employer] after slipping and falling on a wet floor, denies hitting head. Pt complains of left shoulder, left arm, and left leg pain. Pt currently on Plavix.

## 2015-08-18 NOTE — Discharge Instructions (Signed)
You have been seen in the emergency department for left shoulder pain effort fall. Your workup is consistent with a significant left proximal humerus fracture. Please follow-up with an orthopedist as soon as possible. Please take your pain medication as needed, wear your shoulder immobilizer. Return to the [facility] department for any increased pain or any other symptom personally concerning to yourself.   Shoulder Fracture (Proximal Humerus or Glenoid) A shoulder fracture is a broken upper arm bone or a broken socket bone. The humerus is the upper arm bone and the glenoid is the shoulder socket. Proximal means the humerus is broken near the shoulder. Most of the time the bones of a broken shoulder are in an acceptable position. Usually, the injury can be treated with a shoulder immobilizer or sling and swath bandage. These devices support the arm and prevent any shoulder movement. If the bones are not in a good position, then surgery is sometimes needed. Shoulder fractures usually initially cause swelling, pain, and discoloration around the upper arm. They heal in 8 to 12 weeks with proper treatment. SYMPTOMS  Humerus Fracture Treated With Immobilization The humerus is the large bone in your upper arm. You have a broken (fractured) humerus. These fractures are easily diagnosed with X-rays. TREATMENT  Simple fractures which will heal without disability are treated with simple immobilization. Immobilization means you will wear a cast, splint, or sling. You have a fracture which will do well with immobilization. The fracture will heal well simply by being held in a good position until it is stable enough to begin range of motion exercises. Do not take part in activities which would further injure your arm.  HOME CARE INSTRUCTIONS   Put ice on the injured area.  Put ice in a plastic bag.  Place a towel between your skin and the bag.  Leave the ice on for 15-20 minutes, 03-04 times a day.  If you have a  cast:  Do not scratch the skin under the cast using sharp or pointed objects.  Check the skin around the cast every day. You may put lotion on any red or sore areas.  Keep your cast dry and clean.  If you have a splint:  Wear the splint as directed.  Keep your splint dry and clean.  You may loosen the elastic around the splint if your fingers become numb, tingle, or turn cold or blue.  If you have a sling:  Wear the sling as directed.  Do not put pressure on any part of your cast or splint until it is fully hardened.  Your cast or splint can be protected during bathing with a plastic bag. Do not lower the cast or splint into water.  Only take over-the-counter or prescription medicines for pain, discomfort, or fever as directed by your caregiver.  Do range of motion exercises as instructed by your caregiver.  Follow up as directed by your caregiver. This is very important in order to avoid permanent injury or disability and chronic pain. SEEK IMMEDIATE MEDICAL CARE IF:   Your skin or nails in the injured arm turn blue or gray.  Your arm feels cold or numb.  You develop severe pain in the injured arm.  You are having problems with the medicines you were given. MAKE SURE YOU:   Understand these instructions.  Will watch your condition.  Will get help right away if you are not doing well or get worse.   This information is not intended to replace advice given  to you by your health care provider. Make sure you discuss any questions you have with your health care provider.   Document Released: 05/27/2000 Document Revised: 03/11/2014 Document Reviewed: 07/13/2014 Elsevier Interactive Patient Education [employer]  At the time of injury:  Pain.  Tenderness.  Regular body contours are not normal. Later symptoms may include:  Swelling and bruising of the elbow and hand.  Swelling and bruising of the arm or chest. Other symptoms include:  Pain when  lifting or turning the arm.  Paralysis below the fracture.  Numbness or coldness below the fracture. CAUSES   Indirect force from falling on an outstretched arm.  A blow to the shoulder. RISK INCREASES WITH:  Not being in shape.  Playing contact sports, such as football, soccer, hockey, or rugby.  Sports where falling on an outstretched arm occurs, such as basketball, skateboarding, or volleyball.  History of bone or joint disease.  History of shoulder injury. PREVENTION  Warm up before activity.  Stretch before activity.  Stay in shape with your:  Heart fitness.  Flexibility.  Shoulder Strength.  Falling with the proper technique. PROGNOSIS  In adults, healing time is about 7 weeks. For children, healing time is about 5 weeks. Surgery may be needed. RELATED COMPLICATIONS  The bones do not heal together (nonunion).  The bones do not align properly when they heal (malunion).  Long-term problems with pain, stiffness, swelling, or loss of motion.  The injured arm heals shorter than the other.  Nerves are injured in the arm.  Arthritis in the shoulder.  Normal bone growth is interrupted in children.  Blood supply to the shoulder joint is diminished. TREATMENT If the bones are aligned, then initial treatment will be with ice and medicine to help with pain. The shoulder will be held in place with a sling (immobilization). The shoulder will be allowed to heal for up to 6 weeks. Injuries that may need surgery include:  Severe fractures.  Fractures that are not in appropriate alignment (displaced).  Non-displaced fractures (not common). Surgery helps the bones align correctly. The bones may be held in place with:  Sutures.  Wires.  Rods.  Plates.  Screws.  Pins. If you have had surgery or not, you will likely be assisted by a physical therapist or athletic trainer to get the best results with your injured shoulder. This will likely include exercises  to strengthen and stretch the injured and surrounding areas. MEDICATION  If pain medicine is needed, nonsteroidal anti-inflammatory medicines (such as aspirin or ibuprofen) or other minor pain relievers (such as acetaminophen) are often advised.  Do not take pain medicine for 7 days before surgery.  Stronger pain relievers may be prescribed. Use only as directed and take only as much as you need. COLD THERAPY Cold treatment (icing) relieves pain and reduces inflammation. Cold treatment should be applied for 10 to 15 minutes every 2 to 3 hours, and immediately after activity that aggravates your symptoms. Use ice packs or an ice massage. SEEK IMMEDIATE MEDICAL CARE IF:  You have severe shoulder pain unrelieved by rest and taking pain medicine.  You have pain, numbness, tingling, or weakness in the hand or wrist.  You have shortness of breath, chest pain, severe weakness, or fainting.  You have severe pain with motion of the fingers or wrist.  Blue, gray, or dark color appears in the fingernails on injured extremity.   This information is not intended to replace advice given to you by your health  care provider. Make sure you discuss any questions you have with your health care provider.   Document Released: 02/18/2005 Document Revised: 05/13/2011 Document Reviewed: 06/02/2008 Elsevier Interactive Patient Education [employer] Elsevier Inc[employer]

## 2015-08-18 NOTE — ED Provider Notes (Signed)
[facility] Emergency Department Provider Note  Time seen: 10:36 PM  I have reviewed the triage vital signs and the nursing notes.   HISTORY  Chief Complaint Fall    HPI Kelly Blackburn is a 81 y.o. female with a past medical history of PTSD, hypothyroidism, depression, Parkinson's, presents to the emergency departmentafter a fall. According to the patient she was walking into a restaurant when she slipped on the stairs falling backwards 1 or 2 steps landing on her left side. Is not sure if she hit her head. Denies any LOC or headache. Denies any vomiting. Patient is suffering from significant left shoulder pain, with swelling/deformity noted. Describes the pain as 8/10 currently. Worse with any type of movement of the left arm/shoulder.     Past Medical History  Diagnosis Date  . Depression   . PTSD (post-traumatic stress disorder)   . Thyroid disease   . Parkinson disease [facility])     Patient Active Problem List   Diagnosis Date Noted  . Mild major depression (HCC) 12/20/2014  . Moderate major depression (HCC) 12/20/2014  . Neurosis, posttraumatic 12/20/2014  . Allergic rhinitis 12/20/2014  . Acid reflux 06/09/2013  . Clinical depression 06/09/2013  . Chronic obstructive pulmonary disease (HCC) 06/09/2013  . Benign essential tremor 06/09/2013  . Hypercholesterolemia 06/09/2013  . BP (high blood pressure) 06/09/2013  . Adaptive colitis 06/09/2013  . H/O renal calculi 06/09/2013  . Goiter, nontoxic, multinodular 06/09/2013  . Inflamed nasal mucosa 06/09/2013  . Asthma-chronic obstructive pulmonary disease overlap syndrome (HCC) 06/09/2013    Past Surgical History  Procedure Laterality Date  . Cholecystectomy    . Eye surgery      Current Outpatient Rx  Name  Route  Sig  Dispense  Refill  . atorvastatin (LIPITOR) 80 MG tablet   Oral   Take 80 mg by mouth every evening.          . citalopram (CELEXA) 40 MG tablet   Oral   Take 1 tablet  (40 mg total) by mouth daily.   30 tablet   3   . clonazePAM (KLONOPIN) 1 MG tablet   Oral   Take 1 tablet (1 mg total) by mouth 3 (three) times daily.   90 tablet   0   . gabapentin (NEURONTIN) 600 MG tablet   Oral   Take 1 tablet (600 mg total) by mouth at bedtime.   90 tablet   3   . BEPREVE 1.5 % SOLN                 Dispense as written.   . beta carotene 30 MG capsule   Oral   Take 30 mg by mouth daily. Reported on 08/18/2015         . clopidogrel (PLAVIX) 75 MG tablet   Oral   Take 75 mg by mouth daily.          Marland Kitchen DEXILANT 60 MG capsule   Oral   Take 60 mg by mouth daily.            Dispense as written.   Marland Kitchen ELIDEL 1 % cream                 Dispense as written.   . isosorbide mononitrate (IMDUR) 60 MG 24 hr tablet   Oral   Take 60 mg by mouth daily.         Marland Kitchen levocetirizine (XYZAL) 5 MG tablet   Oral  Take 5 mg by mouth every evening.          Marland Kitchen. levothyroxine (SYNTHROID, LEVOTHROID) 25 MCG tablet   Oral   Take 25 mcg by mouth daily.         Marland Kitchen. lisinopril (PRINIVIL,ZESTRIL) 5 MG tablet   Oral   Take 5 mg by mouth daily.         . Multiple Vitamin (MULTIVITAMIN) capsule   Oral   Take by mouth.         . nitroGLYCERIN (NITROSTAT) 0.4 MG SL tablet   Sublingual   Place 0.4 mg under the tongue every 5 (five) minutes as needed for chest pain.          Marland Kitchen. propranolol (INDERAL) 80 MG tablet   Oral   Take 80 mg by mouth daily.          Marland Kitchen. RANEXA 500 MG 12 hr tablet   Oral   Take 500 mg by mouth 2 (two) times daily.            Dispense as written.   Marland Kitchen. EXPIRED: rOPINIRole (REQUIP) 1 MG tablet   Oral   Take by mouth.         Marland Kitchen. rOPINIRole (REQUIP) 1 MG tablet   Oral   Take 1 mg by mouth 2 (two) times daily.         . vitamin C (ASCORBIC ACID) 500 MG tablet   Oral   Take by mouth.         . vitamin E ([employer] E) 200 UNIT capsule   Oral   Take 400 Units by mouth daily.            Allergies Aspirin;  Carbidopa-levodopa; Donepezil hcl; and Lamotrigine  Family History  Problem Relation Age of Onset  . Bipolar disorder Daughter   . Heart disease Mother   . Liver cancer Father   . Heart attack Father   . Depression Father   . Alcohol abuse Father   . Colon cancer Sister   . Skin cancer Brother     Social History Social History  Substance Use Topics  . Smoking status: Former Smoker    Types: Cigarettes    Start date: 12/19/1956    Quit date: 12/20/1966  . Smokeless tobacco: Never Used  . Alcohol Use: No    Review of Systems Constitutional: Negative for fever. Cardiovascular: Negative for chest pain. Respiratory: Negative for shortness of breath. Gastrointestinal: Negative for abdominal pain Musculoskeletal: Left shoulder pain. Skin: Negative for rash. Neurological: Negative for headache 10-point ROS otherwise negative.  ____________________________________________   PHYSICAL EXAM:  VITAL SIGNS: ED Triage Vitals  Enc Vitals Group     BP 08/18/15 2209 162/73 mmHg     Pulse Rate 08/18/15 2209 71     Resp 08/18/15 2209 22     Temp 08/18/15 2209 98.8 F (37.1 C)     Temp Source 08/18/15 2209 Oral     SpO2 08/18/15 2209 98 %     Weight 08/18/15 2209 118 lb (53.524 kg)     Height 08/18/15 2209 5\' 3"  (1.6 m)     Head Cir --      Peak Flow --      Pain Score 08/18/15 2214 10     Pain Loc --      Pain Edu? --      Excl. in GC? --     Constitutional: Alert and oriented. Well appearing and in no distress. Eyes:  Normal exam ENT   Head: Normocephalic and atraumatic.   Mouth/Throat: Mucous membranes are moist. Cardiovascular: Normal rate, regular rhythm. No murmur Respiratory: Normal respiratory effort without tachypnea nor retractions. Breath sounds are clear  Gastrointestinal: Soft and nontender. No distention.   Musculoskeletal: Moderate tenderness to palpation of left shoulder, significant pain with any attempted range of motion. Neurovascular intact  distally. Moderate swelling/deformity of the left shoulder suspect fracture. Neurologic:  Normal speech and language. No gross focal neurologic deficits Skin:  Skin is warm, dry and intact.  Psychiatric: Mood and affect are normal. ____________________________________________   RADIOLOGY  X-ray consistent comminuted impacted humeral neck fracture.  ____________________________________________    INITIAL IMPRESSION / ASSESSMENT AND PLAN / ED COURSE  Pertinent labs & imaging results that were available during my care of the patient were reviewed by me and considered in my medical decision making (see chart for details).  The patient presents the emergency department with a left shoulder injury after a fall. Patient has moderate tenderness to palpation with swelling. Patient takes Plavix, thinks she may put her head but is not sure. We will check x-rays of the left shoulder, left humerus, and CT head. Patient agreeable to plan. We will dose fentanyl for pain control in the emergency department. Suspect likely proximal humerus fracture.  ____________________________________________   FINAL CLINICAL IMPRESSION(S) / ED DIAGNOSES  Proximal humerus fracture/humeral neck fracture Thresa Ross, MD 08/19/15 2351

## 2015-08-19 NOTE — ED Notes (Signed)
Pt discharged to home.  Family member driving.  Discharge instructions reviewed.  Verbalized understanding.  No questions or concerns at this time.  Teach back verified.  Pt in NAD.  No items left in ED.   

## 2015-08-19 NOTE — ED Provider Notes (Signed)
-----------------------------------------   12:32 AM on 08/19/2015 -----------------------------------------   Blood pressure 162/73, pulse 71, temperature 98.8 F (37.1 C), temperature source Oral, resp. rate 22, height 5\' 3"  (1.6 m), weight 118 lb (53.524 kg), SpO2 98 %.  Assuming care from Dr. Lenard LancePaduchowski .  In short, Kelly Blackburn is a 81 y.o. female with a chief complaint of Fall .  Refer to the original H&P for additional details.  The current plan of care is to follow up the results of the CT head.  CT head: No acute intracranial abnormalities, mild chronic atrophy and small vessel ischemic changes.  The patient will receive her shoulder immobilizer and she will be discharged to home. The patient and her family had no further concerns at this time.   Rebecka ApleyAllison P Webster, MD 08/19/15 [phone]

## 2015-11-01 ENCOUNTER — Other Ambulatory Visit: Payer: Self-pay | Admitting: Psychiatry

## 2015-11-23 ENCOUNTER — Other Ambulatory Visit: Payer: Self-pay | Admitting: Psychiatry

## 2015-11-23 MED ORDER — CLONAZEPAM 1 MG PO TABS: 1.0000 mg | ORAL_TABLET | Freq: Three times a day (TID) | ORAL | 0 refills | Status: DC

## 2015-11-23 NOTE — Progress Notes (Signed)
I phoned in a 1 month refill of this. I haven't seen her in 6 months and really to see her again before giving her another month's worth.

## 2015-12-14 ENCOUNTER — Other Ambulatory Visit: Payer: Self-pay | Admitting: Internal Medicine

## 2015-12-18 ENCOUNTER — Other Ambulatory Visit: Payer: Self-pay | Admitting: Internal Medicine

## 2015-12-18 DIAGNOSIS — Z1231 Encounter for screening mammogram for malignant neoplasm of breast: Secondary | ICD-10-CM

## 2016-01-06 DIAGNOSIS — G40919 Epilepsy, unspecified, intractable, without status epilepticus: Secondary | ICD-10-CM | POA: Insufficient documentation

## 2016-01-06 DIAGNOSIS — G20 Parkinson's disease: Secondary | ICD-10-CM | POA: Insufficient documentation

## 2016-01-18 ENCOUNTER — Encounter: Payer: Self-pay | Admitting: Psychiatry

## 2016-01-18 ENCOUNTER — Ambulatory Visit (INDEPENDENT_AMBULATORY_CARE_PROVIDER_SITE_OTHER): Payer: Medicare Other | Admitting: Psychiatry

## 2016-01-18 ENCOUNTER — Other Ambulatory Visit: Payer: Self-pay | Admitting: Psychiatry

## 2016-01-18 VITALS — BP 108/67 | HR 63 | Temp 98.2°F | Resp 16 | Wt 120.4 lb

## 2016-01-18 DIAGNOSIS — F331 Major depressive disorder, recurrent, moderate: Secondary | ICD-10-CM | POA: Diagnosis not present

## 2016-01-18 MED ORDER — CLONAZEPAM 1 MG PO TABS: 1.0000 mg | ORAL_TABLET | Freq: Three times a day (TID) | ORAL | 5 refills | Status: DC

## 2016-01-18 MED ORDER — CITALOPRAM HYDROBROMIDE 40 MG PO TABS: 40.0000 mg | ORAL_TABLET | Freq: Every day | ORAL | 5 refills | Status: DC

## 2016-01-18 MED ORDER — GABAPENTIN 600 MG PO TABS: 600.0000 mg | ORAL_TABLET | Freq: Every day | ORAL | 5 refills | Status: DC | PRN

## 2016-01-18 NOTE — Progress Notes (Signed)
Olla patient with chronic anxiety. Her Parkinson's disease is worse. She is tolerating it okay though. Mood and anxiety good and under control. No panic attacks. No suicidal thoughts no psychotic symptoms.  Neatly groomed. Good eye contact. Clearly suffering more from her Parkinson's disease. Thoughts are lucid. No sign of memory problems or confusion.  Renew medication. Review risks of the benzodiazepines. Supportive counseling and therapy. Follow-up in another 6 months.

## 2016-01-23 ENCOUNTER — Ambulatory Visit
Admission: RE | Admit: 2016-01-23 | Discharge: 2016-01-23 | Disposition: A | Discharge status: Home / Self Care | Payer: Medicare Other | Source: Ambulatory Visit | Attending: Internal Medicine | Admitting: Internal Medicine

## 2016-01-23 DIAGNOSIS — Z1231 Encounter for screening mammogram for malignant neoplasm of breast: Secondary | ICD-10-CM | POA: Insufficient documentation

## 2016-02-17 NOTE — Progress Notes (Signed)
Follow-up care patient with chronic depression and anxiety. Mood is stable. Medical problems continue to trouble her. Not constantly depressed however. Able to enjoy some things in life. Tolerating medicine well.  Neatly dressed woman. Looks her stated age. Affect euthymic. Denies suicidal ideation no evidence of psychosis.  Review medication planning continue all medicines with follow-up in 6 months.

## 2016-05-03 ENCOUNTER — Ambulatory Visit (INDEPENDENT_AMBULATORY_CARE_PROVIDER_SITE_OTHER): Payer: Medicare Other | Admitting: Podiatry

## 2016-05-03 VITALS — BP 111/62 | HR 59 | Resp 16

## 2016-05-03 DIAGNOSIS — M79676 Pain in unspecified toe(s): Secondary | ICD-10-CM | POA: Diagnosis not present

## 2016-05-03 DIAGNOSIS — L600 Ingrowing nail: Secondary | ICD-10-CM

## 2016-05-03 DIAGNOSIS — L03039 Cellulitis of unspecified toe: Secondary | ICD-10-CM | POA: Diagnosis not present

## 2016-05-03 NOTE — Patient Instructions (Signed)

## 2016-05-16 NOTE — Progress Notes (Signed)
   Subjective: Patient presents today for evaluation of pain in toe(s). Patient is concerned for possible ingrown nail. Patient states that the pain has been present for a few weeks now. Patient presents today for further treatment and evaluation.  Objective:  General: Well developed, nourished, in no acute distress, alert and oriented x3   Dermatology: Skin is warm, dry and supple bilateral. Medial border of the bilateral great toes appears to be erythematous with evidence of an ingrowing nail. Pain on palpation noted to the border of the nail fold. The remaining nails appear unremarkable at this time. There are no open sores, lesions.  Vascular: Dorsalis Pedis artery and Posterior Tibial artery pedal pulses palpable. No lower extremity edema noted.   Neruologic: Grossly intact via light touch bilateral.  Musculoskeletal: Muscular strength within normal limits in all groups bilateral. Normal range of motion noted to all pedal and ankle joints.   Assesement: #1 Paronychia with ingrowing nail medial border of the bilateral great toes #2 Pain in toe #3 Incurvated nail  Plan of Care:  1. Patient evaluated.  2. Discussed treatment alternatives and plan of care. Explained nail avulsion procedure and post procedure course to patient. 3. Patient opted for permanent partial nail avulsion.  4. Prior to procedure, local anesthesia infiltration utilized using 3 ml of a 50:50 mixture of 2% plain lidocaine and 0.5% plain marcaine in a normal hallux block fashion and a betadine prep performed.  5. Partial permanent nail avulsion with chemical matrixectomy performed using 3x30sec applications of phenol followed by alcohol flush.  6. Light dressing applied. 7. Return to clinic in 2 weeks.   Siboney Requejo M. Cozetta Seif, DPM [facility]  Dr. Raechelle Sarti M. Brice Potteiger, DPM    [address]                                        [place], [place] [address]                Office [phone]  Fax [phone]      

## 2016-05-21 ENCOUNTER — Ambulatory Visit (INDEPENDENT_AMBULATORY_CARE_PROVIDER_SITE_OTHER): Payer: Medicare Other | Admitting: Podiatry

## 2016-05-21 ENCOUNTER — Encounter: Payer: Self-pay | Admitting: Podiatry

## 2016-05-21 DIAGNOSIS — M79676 Pain in unspecified toe(s): Secondary | ICD-10-CM

## 2016-05-21 DIAGNOSIS — S91209D Unspecified open wound of unspecified toe(s) with damage to nail, subsequent encounter: Secondary | ICD-10-CM | POA: Diagnosis not present

## 2016-05-21 DIAGNOSIS — S91109D Unspecified open wound of unspecified toe(s) without damage to nail, subsequent encounter: Secondary | ICD-10-CM | POA: Diagnosis not present

## 2016-05-24 DIAGNOSIS — F5104 Psychophysiologic insomnia: Secondary | ICD-10-CM | POA: Insufficient documentation

## 2016-05-29 NOTE — Progress Notes (Signed)
   Subjective: Patient presents today 2 weeks post ingrown nail permanent nail avulsion procedure. Patient states that the toe and nail fold is feeling much better.  Objective: Skin is warm, dry and supple. Nail and respective nail fold appears to be healing appropriately. Open wound to the associated nail fold with a granular wound base and moderate amount of fibrotic tissue. Minimal drainage noted. Mild erythema around the periungual region likely due to phenol chemical matricectomy.  Assessment: #1 postop permanent partial nail avulsion #2 open wound periungual nail fold of respective digit.   Plan of care: #1 patient was evaluated  #2 debridement of open wound was performed to the periungual border of the respective toe using a currette. Antibiotic ointment and Band-Aid was applied. #3 patient is to return to clinic on a PRN  basis.   Justa Hatchell M. Azelea Seguin, DPM [facility]  Dr. Zeba Luby M. Tyreik Delahoussaye, DPM    [address]                                        [place], [place] [address]                Office [phone]  Fax [phone]      

## 2016-07-16 ENCOUNTER — Encounter: Payer: Self-pay | Admitting: Psychiatry

## 2016-07-16 ENCOUNTER — Ambulatory Visit (INDEPENDENT_AMBULATORY_CARE_PROVIDER_SITE_OTHER): Payer: Medicare Other | Admitting: Psychiatry

## 2016-07-16 VITALS — BP 84/51 | HR 63 | Temp 97.8°F | Wt 115.8 lb

## 2016-07-16 DIAGNOSIS — F331 Major depressive disorder, recurrent, moderate: Secondary | ICD-10-CM

## 2016-07-16 MED ORDER — CITALOPRAM HYDROBROMIDE 40 MG PO TABS: 40.0000 mg | ORAL_TABLET | Freq: Every day | ORAL | 5 refills | Status: DC

## 2016-07-16 MED ORDER — GABAPENTIN 600 MG PO TABS: 600.0000 mg | ORAL_TABLET | Freq: Every day | ORAL | 5 refills | Status: AC | PRN

## 2016-07-16 MED ORDER — CLONAZEPAM 1 MG PO TABS: 1.0000 mg | ORAL_TABLET | Freq: Three times a day (TID) | ORAL | 5 refills | Status: AC

## 2016-07-16 NOTE — Progress Notes (Signed)
Patient seen. No new complaints psychiatrically. Her Parkinson's disease is progressing but she is still managing to live independently. Mood is stable. Denies depression. Anxiety under control. She is a little slow but not much worse than usual. Appears alert and oriented and not cognitively impaired. Denies suicidal thoughts.  Neatly dressed and groomed. On time. Good eye contact. Affect euthymic and appropriate. Thoughts lucid. No suicidal ideation.  Review medication plan. Patient does not feel that she's having any oversedation from her medicine. Reviewed the risks of that. Refill medicine and follow-up in 6 months.

## 2016-11-11 ENCOUNTER — Other Ambulatory Visit: Payer: Self-pay | Admitting: Psychiatry

## 2016-11-11 ENCOUNTER — Telehealth: Payer: Self-pay

## 2016-11-11 MED ORDER — CITALOPRAM HYDROBROMIDE 40 MG PO TABS: 40.0000 mg | ORAL_TABLET | Freq: Every day | ORAL | 5 refills | Status: AC

## 2016-11-11 NOTE — Telephone Encounter (Signed)
yes

## 2016-11-11 NOTE — Telephone Encounter (Signed)
pt called states that she has been crying alot and wants to know if you would put her back on the celexa  pt states that her last visit she talked about being on so many medication so you only gave her one refilll. pt  would like to go back on and have rx sent to [facility] on [address]. Pt was last seen on  5-15 and next appt is  11-15

## 2016-11-19 ENCOUNTER — Other Ambulatory Visit: Payer: Self-pay | Admitting: Internal Medicine

## 2016-11-19 DIAGNOSIS — Z1231 Encounter for screening mammogram for malignant neoplasm of breast: Secondary | ICD-10-CM

## 2016-12-26 ENCOUNTER — Other Ambulatory Visit: Payer: Self-pay

## 2016-12-26 NOTE — Telephone Encounter (Signed)
[place]  he is not taking the celexa anymore but she needs a rx sent for klonopin.

## 2016-12-27 NOTE — Telephone Encounter (Signed)
I am happy to do this, and [facility], but [place], from what I remember, she will need paper prescriptions sent to her because of their way of dealing with controlled substances.  I will look in the chart and see if I can find and address.  If there is an address we know of that we can send them to I will do that.

## 2016-12-27 NOTE — Telephone Encounter (Signed)
I spoke to the patient.  Also spoke to her daughter, Ezequiel EssexCarol Kreighbaum [phone].  Patient asked me to just call it into the [facility] here locally and a family member can pick it up and send it to her.  So I have done that.

## 2017-01-08 ENCOUNTER — Telehealth: Payer: Self-pay

## 2017-01-08 NOTE — Telephone Encounter (Signed)
pt daughter called states she very concerned about her mother. states that her mom was over using medication and they had to get her and [place] .  she states she is trying to get her in with another doctor up there but it is a long process because her insurance has to be changed. It is a mess.  She states her mom has the anger outburst and she get out of control.  She like to speak with you and see if there is anything that can be done.   [phone]

## 2017-01-09 NOTE — Telephone Encounter (Signed)
I returned the call and spoke to the daughter.  We talked at some length about [name] problems.  It turns out she is not taking the citalopram.  It sounds like she got confused about that.  I told the daughter I would recommend that she be on the citalopram and also not the daughter continue her efforts to get Kelly Blackburn in to see a psychiatrist and primary care doctor as soon as possible.  She agrees to that.

## 2017-01-16 ENCOUNTER — Ambulatory Visit: Payer: Medicare Other | Admitting: Psychiatry

## 2017-02-13 ENCOUNTER — Telehealth: Payer: Self-pay

## 2017-02-13 NOTE — Telephone Encounter (Signed)
pt daughter called states that her mother is almost out of the celexa and needs it called into [facility] in [place] at [phone].     Disp Refills Start End   citalopram (CELEXA) 40 MG tablet 30 tablet 5 11/11/2016    Sig - Route: Take 1 tablet (40 mg total) by mouth daily. - Oral   Class: Phone In

## 2017-02-13 NOTE — Telephone Encounter (Signed)
I called it in and it is done

## 2017-03-25 ENCOUNTER — Ambulatory Visit: Payer: Medicare Other | Admitting: Psychiatry

## 2017-10-09 IMAGING — CR DG SHOULDER 2+V*L*
1 series · 3 of 3 positions shown · non-contrast
Comparison: None.

CLINICAL DATA: Left shoulder pain status post fall.

EXAM:
LEFT HUMERUS - 2+ VIEW; LEFT SHOULDER - 2+ VIEW

[Series 1: w shoulder external left · 0.14mm/px · 3 of 3 slices shown]
[im 1/3]
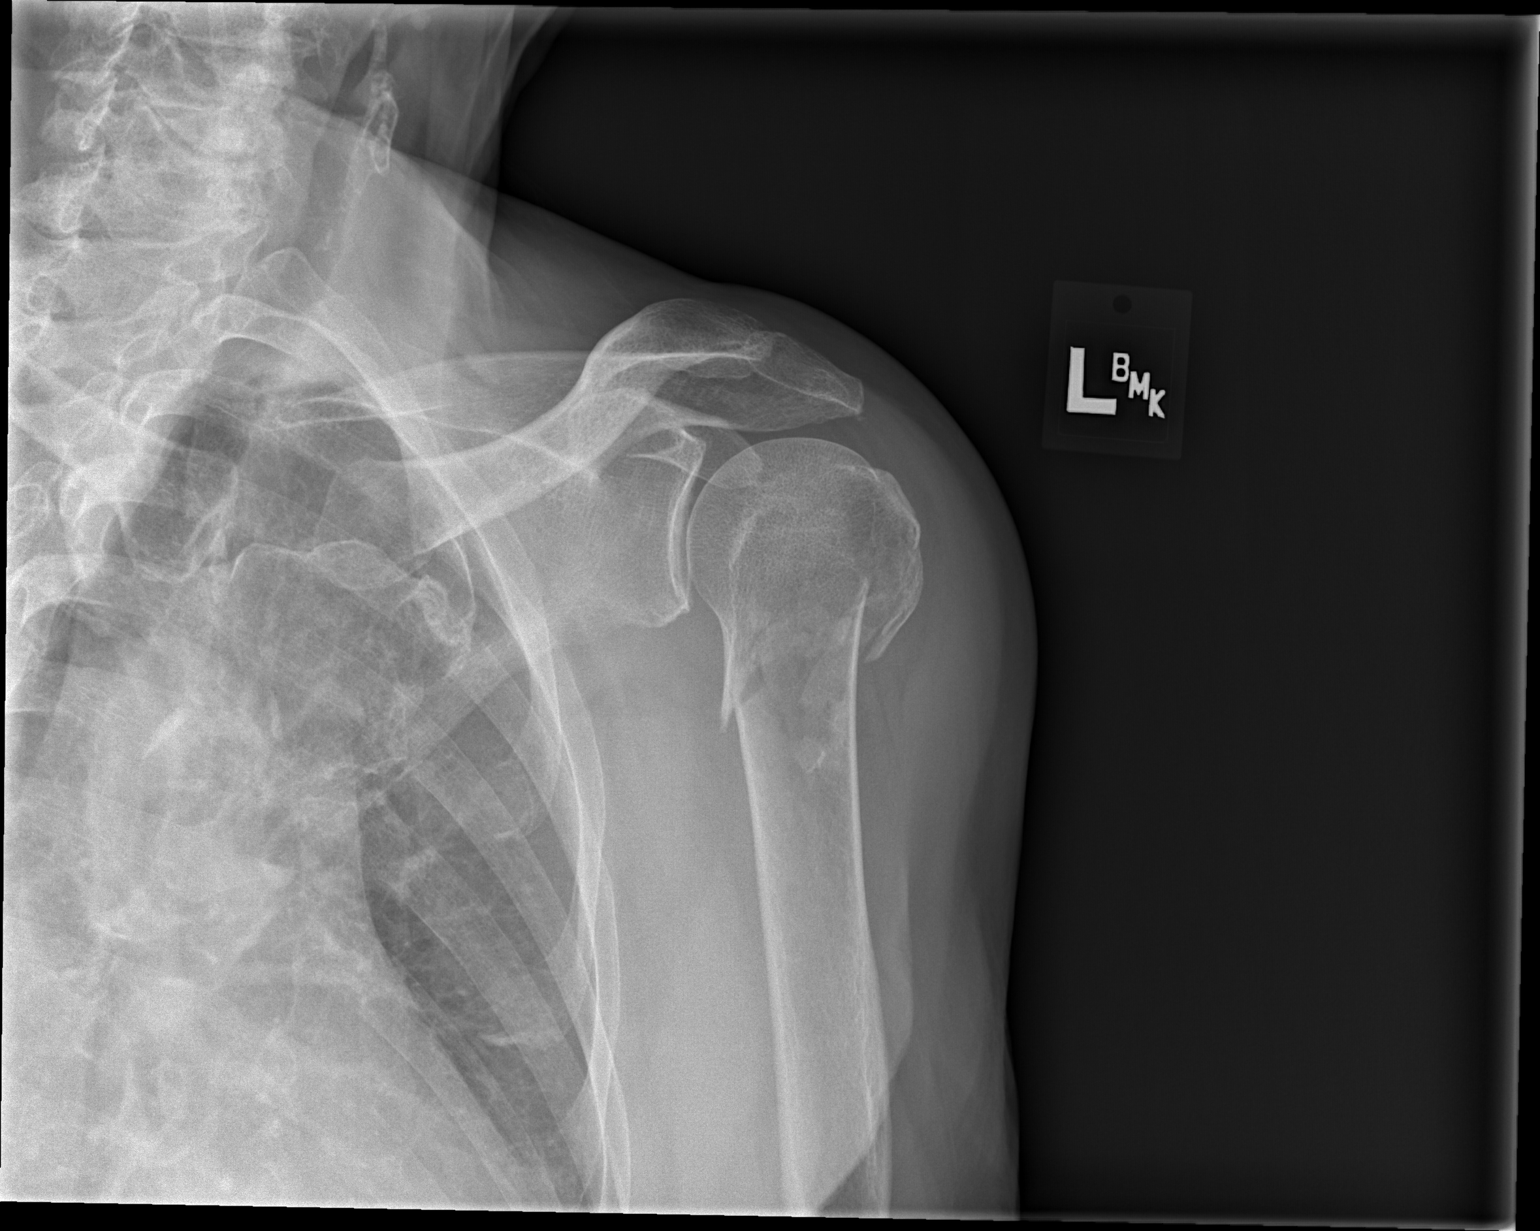
[im 2/3]
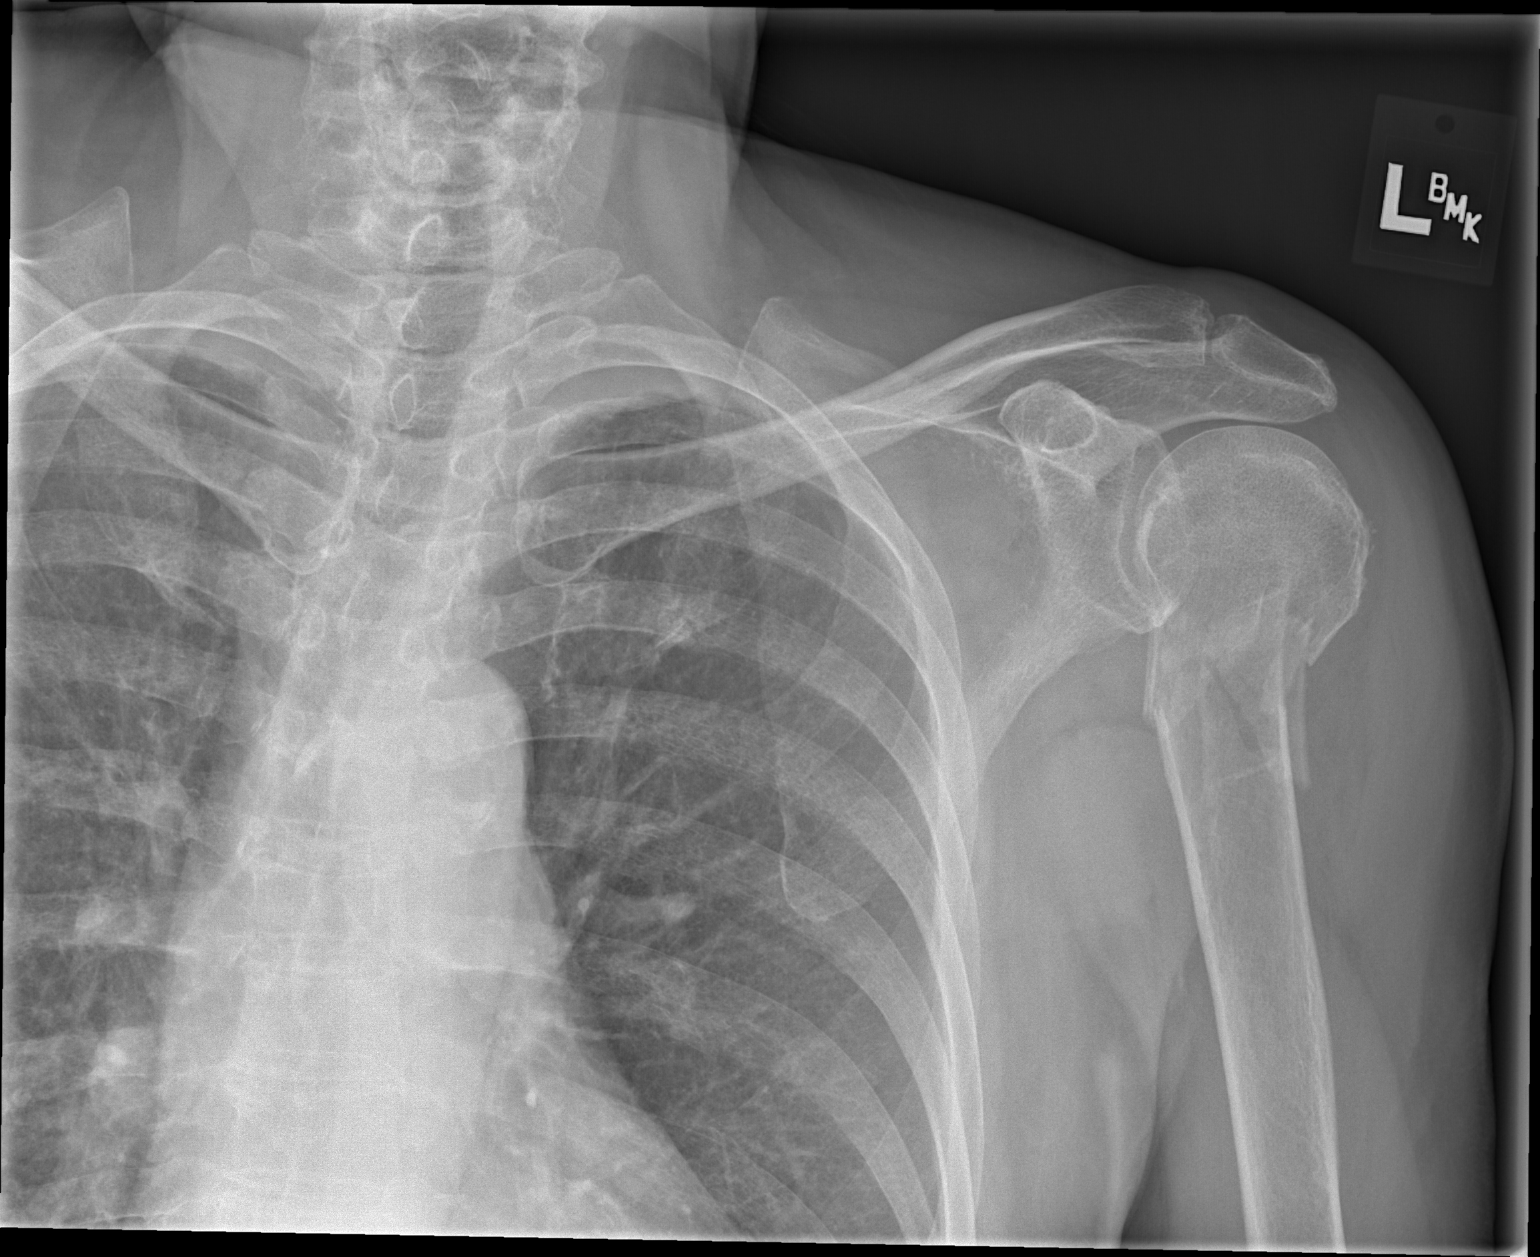
[im 3/3]
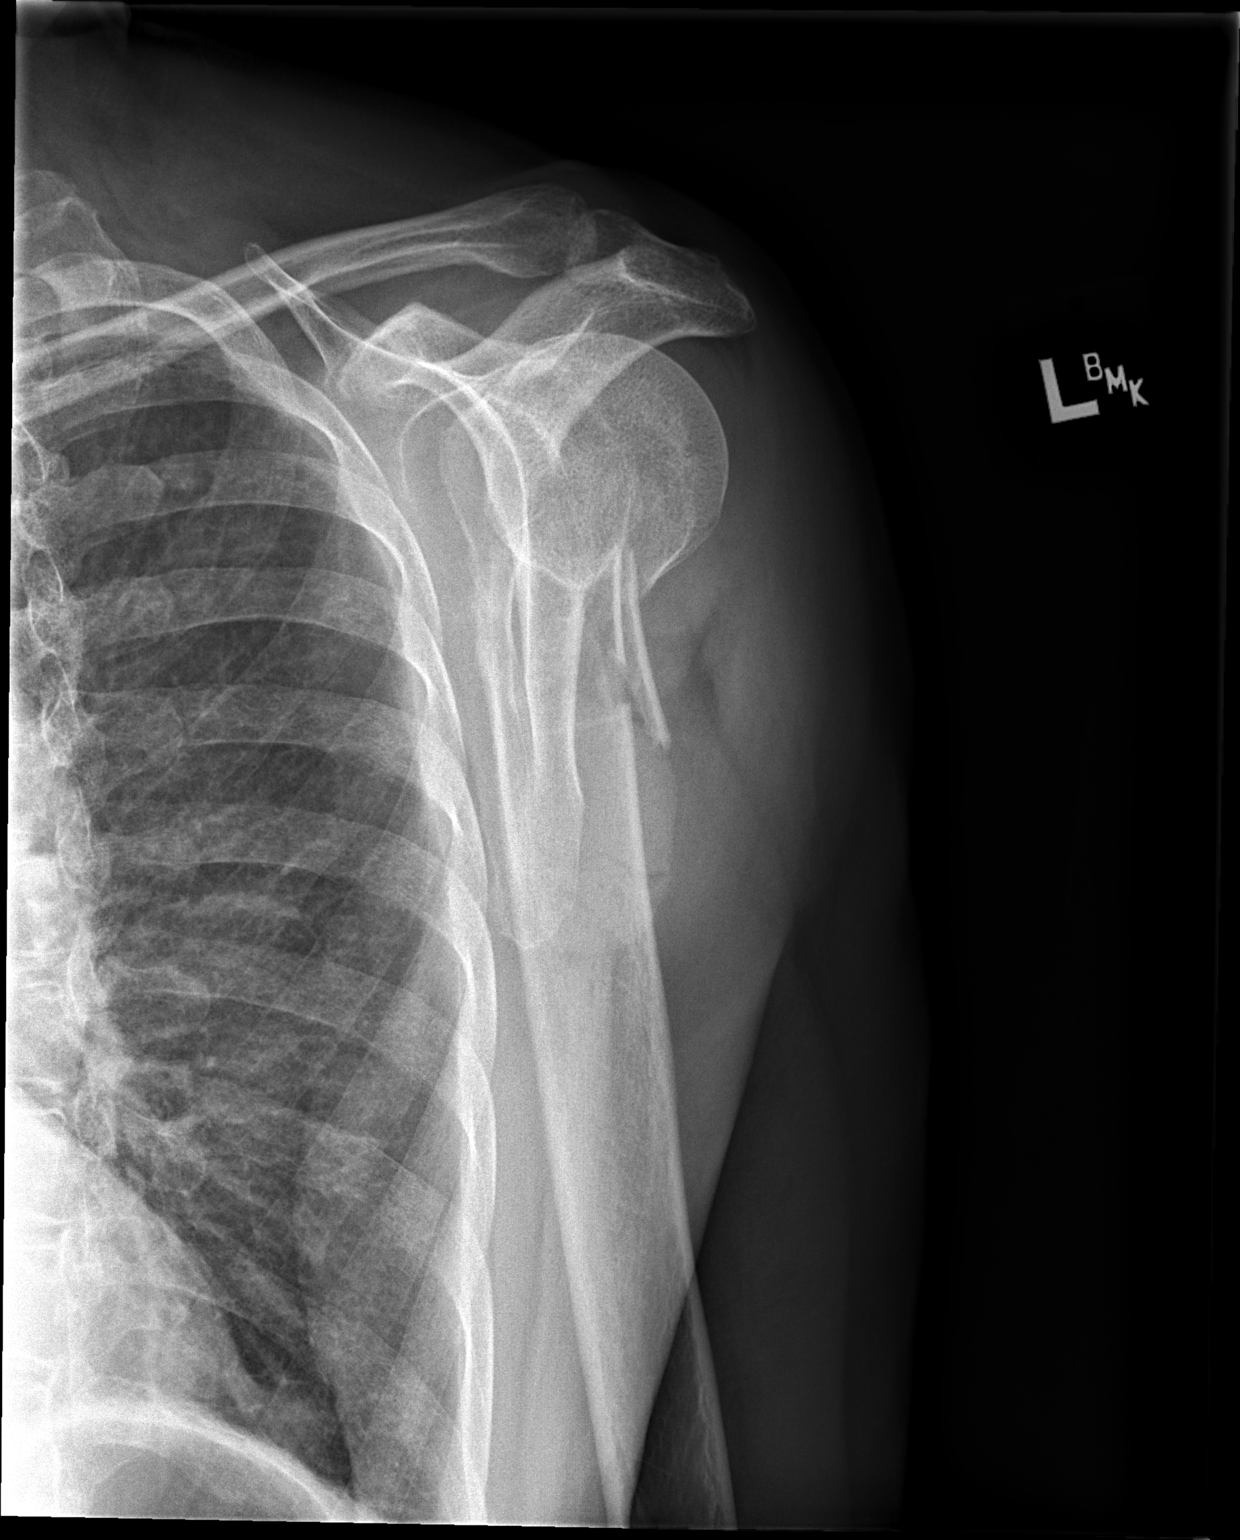

[3 of 3 positions shown; findings below may reference images not displayed]

FINDINGS: There is a comminuted impacted fracture of the left humeral neck
without significant angulation. The glenohumeral joint is normally
located. There is mild osteopenia.
IMPRESSION: Comminuted impacted fracture of the left humeral neck.
